# Patient Record
Sex: Female | Born: 1970
Health system: Southern US, Community
[De-identification: ages and names within clinical notes are randomized; demographics above are authoritative.]

## PROBLEM LIST (undated history)

## (undated) DIAGNOSIS — Z8619 Personal history of other infectious and parasitic diseases: Secondary | ICD-10-CM

## (undated) DIAGNOSIS — K219 Gastro-esophageal reflux disease without esophagitis: Secondary | ICD-10-CM

## (undated) DIAGNOSIS — M79604 Pain in right leg: Secondary | ICD-10-CM

## (undated) DIAGNOSIS — I499 Cardiac arrhythmia, unspecified: Secondary | ICD-10-CM

## (undated) DIAGNOSIS — D649 Anemia, unspecified: Secondary | ICD-10-CM

## (undated) DIAGNOSIS — M7989 Other specified soft tissue disorders: Secondary | ICD-10-CM

## (undated) DIAGNOSIS — M533 Sacrococcygeal disorders, not elsewhere classified: Secondary | ICD-10-CM

## (undated) DIAGNOSIS — M79605 Pain in left leg: Secondary | ICD-10-CM

## (undated) DIAGNOSIS — E669 Obesity, unspecified: Secondary | ICD-10-CM

## (undated) HISTORY — DX: Gastro-esophageal reflux disease without esophagitis: K21.9

## (undated) HISTORY — PX: TONSILLECTOMY: SUR1361

## (undated) HISTORY — DX: Cardiac arrhythmia, unspecified: I49.9

## (undated) HISTORY — DX: Other specified soft tissue disorders: M79.89

## (undated) HISTORY — PX: CHOLECYSTECTOMY: SHX55

## (undated) HISTORY — DX: Pain in right leg: M79.604

## (undated) HISTORY — DX: Obesity, unspecified: E66.9

## (undated) HISTORY — DX: Personal history of other infectious and parasitic diseases: Z86.19

## (undated) HISTORY — DX: Pain in left leg: M79.605

## (undated) HISTORY — DX: Anemia, unspecified: D64.9

---

## 2001-02-23 ENCOUNTER — Other Ambulatory Visit: Admission: RE | Admit: 2001-02-23 | Discharge: 2001-02-23 | Payer: Self-pay | Admitting: Obstetrics

## 2001-02-23 ENCOUNTER — Encounter (INDEPENDENT_AMBULATORY_CARE_PROVIDER_SITE_OTHER): Payer: Self-pay

## 2001-09-20 ENCOUNTER — Other Ambulatory Visit: Admission: RE | Admit: 2001-09-20 | Discharge: 2001-09-20 | Payer: Self-pay | Admitting: Obstetrics and Gynecology

## 2002-01-11 ENCOUNTER — Other Ambulatory Visit: Admission: RE | Admit: 2002-01-11 | Discharge: 2002-01-11 | Payer: Self-pay | Admitting: Obstetrics and Gynecology

## 2002-07-26 ENCOUNTER — Other Ambulatory Visit: Admission: RE | Admit: 2002-07-26 | Discharge: 2002-07-26 | Payer: Self-pay | Admitting: Obstetrics and Gynecology

## 2003-01-09 ENCOUNTER — Other Ambulatory Visit: Admission: RE | Admit: 2003-01-09 | Discharge: 2003-01-09 | Payer: Self-pay | Admitting: Obstetrics and Gynecology

## 2003-05-30 ENCOUNTER — Ambulatory Visit (HOSPITAL_COMMUNITY): Admission: RE | Admit: 2003-05-30 | Discharge: 2003-05-30 | Payer: Self-pay | Admitting: Family Medicine

## 2003-05-30 ENCOUNTER — Encounter: Payer: Self-pay | Admitting: Family Medicine

## 2003-09-25 ENCOUNTER — Other Ambulatory Visit: Admission: RE | Admit: 2003-09-25 | Discharge: 2003-09-25 | Payer: Self-pay | Admitting: Obstetrics and Gynecology

## 2004-11-24 ENCOUNTER — Encounter: Payer: Self-pay | Admitting: Cardiovascular Disease

## 2004-11-24 ENCOUNTER — Ambulatory Visit: Payer: Self-pay | Admitting: Family Medicine

## 2004-12-13 ENCOUNTER — Ambulatory Visit: Payer: Self-pay

## 2004-12-31 ENCOUNTER — Other Ambulatory Visit: Admission: RE | Admit: 2004-12-31 | Discharge: 2004-12-31 | Payer: Self-pay | Admitting: Obstetrics and Gynecology

## 2006-04-19 LAB — CONVERTED CEMR LAB: Pap Smear: NORMAL

## 2007-02-23 ENCOUNTER — Ambulatory Visit: Payer: Self-pay | Admitting: Family Medicine

## 2007-02-23 DIAGNOSIS — R87619 Unspecified abnormal cytological findings in specimens from cervix uteri: Secondary | ICD-10-CM | POA: Insufficient documentation

## 2007-02-23 DIAGNOSIS — N859 Noninflammatory disorder of uterus, unspecified: Secondary | ICD-10-CM | POA: Insufficient documentation

## 2007-02-26 LAB — CONVERTED CEMR LAB
ALT: 13 units/L (ref 0–35)
AST: 16 units/L (ref 0–37)
Albumin: 3.9 g/dL (ref 3.5–5.2)
Alkaline Phosphatase: 74 units/L (ref 39–117)
BUN: 10 mg/dL (ref 6–23)
Basophils Absolute: 0.1 10*3/uL (ref 0.0–0.1)
Basophils Relative: 0.8 % (ref 0.0–1.0)
Bilirubin, Direct: 0.1 mg/dL (ref 0.0–0.3)
CO2: 27 meq/L (ref 19–32)
Calcium: 8.8 mg/dL (ref 8.4–10.5)
Chloride: 110 meq/L (ref 96–112)
Cholesterol: 194 mg/dL (ref 0–200)
Creatinine, Ser: 0.7 mg/dL (ref 0.4–1.2)
Eosinophils Absolute: 0.1 10*3/uL (ref 0.0–0.6)
Eosinophils Relative: 1.3 % (ref 0.0–5.0)
GFR calc Af Amer: 120 mL/min
GFR calc non Af Amer: 100 mL/min
Glucose, Bld: 86 mg/dL (ref 70–99)
HCT: 41.4 % (ref 36.0–46.0)
HDL: 50.7 mg/dL (ref >39.0)
Hemoglobin: 14.2 g/dL (ref 12.0–15.0)
LDL Cholesterol: 127 mg/dL — ABNORMAL HIGH (ref 0–99)
Lymphocytes Relative: 41.9 % (ref 12.0–46.0)
MCHC: 34.2 g/dL (ref 30.0–36.0)
MCV: 92.5 fL (ref 78.0–100.0)
Monocytes Absolute: 0.6 10*3/uL (ref 0.2–0.7)
Monocytes Relative: 8.1 % (ref 3.0–11.0)
Neutro Abs: 3.8 10*3/uL (ref 1.4–7.7)
Neutrophils Relative %: 47.9 % (ref 43.0–77.0)
Platelets: 307 10*3/uL (ref 150–400)
Potassium: 4.3 meq/L (ref 3.5–5.1)
RBC: 4.48 M/uL (ref 3.87–5.11)
RDW: 12.3 % (ref 11.5–14.6)
Sodium: 142 meq/L (ref 135–145)
TSH: 1.45 microintl units/mL (ref 0.35–5.50)
Total Bilirubin: 0.6 mg/dL (ref 0.3–1.2)
Total CHOL/HDL Ratio: 3.8
Total Protein: 6.5 g/dL (ref 6.0–8.3)
Triglycerides: 84 mg/dL (ref 0–149)
VLDL: 17 mg/dL (ref 0–40)
WBC: 8 10*3/uL (ref 4.5–10.5)

## 2008-10-21 ENCOUNTER — Encounter: Payer: Self-pay | Admitting: Cardiovascular Disease

## 2009-05-22 ENCOUNTER — Encounter: Payer: Self-pay | Admitting: Cardiovascular Disease

## 2009-05-26 ENCOUNTER — Encounter: Payer: Self-pay | Admitting: Cardiovascular Disease

## 2009-06-22 ENCOUNTER — Ambulatory Visit: Payer: Self-pay | Admitting: Cardiovascular Disease

## 2009-06-22 DIAGNOSIS — E782 Mixed hyperlipidemia: Secondary | ICD-10-CM | POA: Insufficient documentation

## 2009-06-22 DIAGNOSIS — I080 Rheumatic disorders of both mitral and aortic valves: Secondary | ICD-10-CM | POA: Insufficient documentation

## 2009-06-22 DIAGNOSIS — R002 Palpitations: Secondary | ICD-10-CM

## 2009-07-03 ENCOUNTER — Ambulatory Visit: Payer: Self-pay

## 2009-07-03 ENCOUNTER — Ambulatory Visit (HOSPITAL_COMMUNITY): Admission: RE | Admit: 2009-07-03 | Discharge: 2009-07-03 | Payer: Self-pay | Admitting: Cardiovascular Disease

## 2009-07-03 ENCOUNTER — Ambulatory Visit: Payer: Self-pay | Admitting: Internal Medicine

## 2009-07-03 ENCOUNTER — Encounter: Payer: Self-pay | Admitting: Cardiovascular Disease

## 2009-08-11 ENCOUNTER — Telehealth (INDEPENDENT_AMBULATORY_CARE_PROVIDER_SITE_OTHER): Payer: Self-pay | Admitting: *Deleted

## 2010-09-07 NOTE — Progress Notes (Signed)
  Phone Note Other Incoming   Caller: Clydie Braun  Reason for Call: Get patient information Action Taken: Information Sent Initial call taken by: Denny Peon    Faxed all Recent Cardiac over to [facility] to fax [phone] [facility]  August 11, 2009 12:59 PM

## 2013-01-28 ENCOUNTER — Other Ambulatory Visit: Payer: Self-pay | Admitting: *Deleted

## 2013-01-28 ENCOUNTER — Encounter: Payer: Self-pay | Admitting: Surgery

## 2013-01-28 DIAGNOSIS — R609 Edema, unspecified: Secondary | ICD-10-CM

## 2013-02-13 ENCOUNTER — Encounter: Payer: Self-pay | Admitting: Vascular Surgery

## 2013-02-14 ENCOUNTER — Encounter: Payer: Self-pay | Admitting: Vascular Surgery

## 2013-03-04 ENCOUNTER — Encounter: Payer: Self-pay | Admitting: Surgery

## 2013-03-04 ENCOUNTER — Encounter: Payer: Self-pay | Admitting: Vascular Surgery

## 2013-03-05 ENCOUNTER — Ambulatory Visit (INDEPENDENT_AMBULATORY_CARE_PROVIDER_SITE_OTHER): Payer: Commercial Managed Care - PPO | Admitting: Vascular Surgery

## 2013-03-05 ENCOUNTER — Encounter (INDEPENDENT_AMBULATORY_CARE_PROVIDER_SITE_OTHER): Payer: Commercial Managed Care - PPO | Admitting: *Deleted

## 2013-03-05 ENCOUNTER — Encounter: Payer: Self-pay | Admitting: Vascular Surgery

## 2013-03-05 VITALS — BP 116/78 | HR 80 | Resp 18 | Ht 66.5 in | Wt 223.2 lb

## 2013-03-05 DIAGNOSIS — R609 Edema, unspecified: Secondary | ICD-10-CM

## 2013-03-05 DIAGNOSIS — M7989 Other specified soft tissue disorders: Secondary | ICD-10-CM | POA: Insufficient documentation

## 2013-03-05 DIAGNOSIS — M79609 Pain in unspecified limb: Secondary | ICD-10-CM

## 2013-03-05 DIAGNOSIS — I83893 Varicose veins of bilateral lower extremities with other complications: Secondary | ICD-10-CM

## 2013-03-05 NOTE — Progress Notes (Signed)
[facility] [place]   Patient name: Wendy Cummings MRN: [id number] DOB: 09/13/1970 Sex: female   Referred by: [place]  Reason for referral:  Chief Complaint  Patient presents with  . New Evaluation    c/o bilateral leg pain and swelling L>R   worse with walking for exercise    HISTORY OF PRESENT ILLNESS: Patient has today for dilation of bilateral lower surety pain and some swelling. She has a somewhat unusual situation. She reports that she has pain in both feet when she first arises in the morning that he can last several minutes and improves. If she does a great deal of exercise she can have some swelling in both legs and some discomfort as well. At this is not true calf claudication. She does not have swelling in that she is exercising. She has seen orthopedics for evaluation with no evidence for any cause of her discomfort from an orthopedic standpoint  Past Medical History  Diagnosis Date  . Leg pain, bilateral   . Leg swelling     History reviewed. No pertinent past surgical history.  History   Social History  . Marital Status: Single    Spouse Name: N/A    Number of Children: N/A  . Years of Education: N/A   Occupational History  . Not on file.   Social History Main Topics  . Smoking status: Former Smoker    Quit date: 01/31/2012  . Smokeless tobacco: Not on file  . Alcohol Use: No  . Drug Use: No  . Sexually Active: Not on file   Other Topics Concern  . Not on file   Social History Narrative  . No narrative on file    History reviewed. No pertinent family history.  Allergies as of 03/05/2013 - Review Complete 03/05/2013  Allergen Reaction Noted  . Tetanus toxoid      Current Outpatient Prescriptions on File Prior to Visit  Medication Sig Dispense Refill  . Multiple Vitamin (MULTIVITAMIN) capsule Take 1 capsule by mouth daily.      . MetroNIDAZOLE (FLAGYL PO) Take by mouth as needed.       No current facility-administered  medications on file prior to visit.     REVIEW OF SYSTEMS:  Positives indicated with an "X"  CARDIOVASCULAR:  [ ]  chest pain   [ ]  chest pressure   [x ] palpitations   [ ]  orthopnea   [ ]  dyspnea on exertion   [ ]  claudication   [ ]  rest pain   [ ]  DVT   [ ]  phlebitis PULMONARY:   [ ]  productive cough   [ ]  asthma   [ ]  wheezing NEUROLOGIC:   [ ]  weakness  [x ] paresthesias  [ ]  aphasia  [ ]  amaurosis  [ ]  dizziness HEMATOLOGIC:   [ ]  bleeding problems   [ ]  clotting disorders MUSCULOSKELETAL:  [ ]  joint pain   [ ]  joint swelling GASTROINTESTINAL: [ ]   blood in stool  [ ]   hematemesis GENITOURINARY:  [ ]   dysuria  [ ]   hematuria PSYCHIATRIC:  [ ]  history of major depression INTEGUMENTARY:  [ ]  rashes  [ ]  ulcers CONSTITUTIONAL:  [ ]  fever   [ ]  chills  PHYSICAL EXAMINATION:  General: The patient is a well-nourished female, in no acute distress. Vital signs are BP 116/78  Pulse 80  Resp 18  Ht 5' 6.5" (1.689 m)  Wt 223 lb 3.2 oz (101.243 kg)  BMI 35.49 kg/m2  Pulmonary: There is a good air exchange bilaterally  Abdomen: Soft and non-tender  Musculoskeletal: There are no major deformities.  There is no significant extremity pain. Neurologic: No focal weakness or paresthesias are detected, Skin: There are no ulcer or rashes noted. Psychiatric: The patient has normal affect. Pulse status: 2+ radial 2+ femoral and 2+ dorsalis pedis pulses bilaterally No evidence of venous varicosities or telangiectasia. Swelling in  [facility] Vascular Lab Studies:  Ordered and Independently Reviewed vascular lab studies are near normal. She has no evidence of significant deep or superficial reflux in her left leg and does have mild reflux in the mid thigh on the right leg great saphenous vein.  Impression and Plan:  Long discussion with the patient. She does not have any evidence of arterial and no significant venous pathology to explain her pain. I explained that I do not feel that there should be  any limitation to her walking program. She does not have any evidence of lymphedema or other issues. She will followup with [place] on an as-needed basis    Wendy Cummings [facility] [place] Office: [phone]

## 2013-08-08 HISTORY — PX: PLANTAR FASCIA SURGERY: SHX746

## 2017-01-06 HISTORY — PX: BARIATRIC SURGERY: SHX1103

## 2017-09-18 ENCOUNTER — Other Ambulatory Visit: Payer: Self-pay | Admitting: Obstetrics and Gynecology

## 2017-09-18 DIAGNOSIS — R928 Other abnormal and inconclusive findings on diagnostic imaging of breast: Secondary | ICD-10-CM

## 2017-09-22 ENCOUNTER — Ambulatory Visit
Admission: RE | Admit: 2017-09-22 | Discharge: 2017-09-22 | Disposition: A | Discharge status: Home / Self Care | Payer: 59 | Source: Ambulatory Visit | Attending: Obstetrics and Gynecology | Admitting: Obstetrics and Gynecology

## 2017-09-22 ENCOUNTER — Ambulatory Visit: Payer: Commercial Managed Care - PPO

## 2017-09-22 DIAGNOSIS — R928 Other abnormal and inconclusive findings on diagnostic imaging of breast: Secondary | ICD-10-CM

## 2018-10-05 LAB — HM MAMMOGRAPHY

## 2019-03-09 DIAGNOSIS — U071 COVID-19: Secondary | ICD-10-CM

## 2019-03-09 HISTORY — DX: COVID-19: U07.1

## 2019-04-25 ENCOUNTER — Ambulatory Visit: Payer: 59 | Admitting: Family Medicine

## 2019-05-16 ENCOUNTER — Encounter: Payer: Self-pay | Admitting: Family Medicine

## 2019-05-16 ENCOUNTER — Ambulatory Visit: Payer: 59 | Admitting: Family Medicine

## 2019-05-16 VITALS — BP 138/80 | HR 77 | Temp 97.7°F | Ht 66.5 in | Wt 166.6 lb

## 2019-05-16 DIAGNOSIS — Z7689 Persons encountering health services in other specified circumstances: Secondary | ICD-10-CM | POA: Diagnosis not present

## 2019-05-16 DIAGNOSIS — R195 Other fecal abnormalities: Secondary | ICD-10-CM

## 2019-05-16 DIAGNOSIS — H5712 Ocular pain, left eye: Secondary | ICD-10-CM

## 2019-05-16 DIAGNOSIS — R519 Headache, unspecified: Secondary | ICD-10-CM

## 2019-05-16 MED ORDER — AMITRIPTYLINE HCL 25 MG PO TABS: 25.0000 mg | ORAL_TABLET | Freq: Every day | ORAL | 0 refills | Status: DC

## 2019-05-16 NOTE — Progress Notes (Signed)
Wendy Cummings is a 48 y.o. female  Chief Complaint  Patient presents with  . Establish Care  . Eye Problem    left eye, when she has a flare up, vision becoming blurry, scalp feels tight, & diarrhea     HPI: Wendy Cummings is a 48 y.o. female here to establish care with our office.  Previous PCP Dr. Zola Button. Left eye: Most recently seen at [facility] but issue started in 07/2017. She had full panel of labs including auto-immune. 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 She is on acyclovir 800mg  TID. Pt has persistent cloudy vision due to scar on cornea.  Pt has "flare-ups" - Scalp hurts (not headache) for about 2 days then goes away, then Lt eye hurts, may look red, and pt feels as though her "eyeball is swollen". Pt endorses light sensitivity. Pt also endorses increase diarrhea (increased frequency and more watery) during this time. Pt has had diarrhea since GB was removed in 01/2017. She feels symptoms/flares occur after period of increased stress. Prednisone gtts help with occular symptoms noted above.  Pt sees Dr. 02/2017 ("auto-immune doctor for the eye") - normal eval thus far. Pt states he had recommended she try a medication "for arthritis" which she filled but then read the package inset and "warning" that came with the med and she elected not to take it.   Pt had weight loss surgery in 01/2017 - sleeve - as well as cholecystectomy due to "sludge".  Pt has lost 110lbs.  Dr. 02/2017 - bariatric surgery  Past Medical History:  Diagnosis Date  . Leg pain, bilateral   . Leg swelling     Past Surgical History:  Procedure Laterality Date  . BARIATRIC SURGERY  01/2017   gastric sleeve   . CHOLECYSTECTOMY    . PLANTAR FASCIA SURGERY  2015  . TONSILLECTOMY      Social History   Socioeconomic History  . Marital status: Married    Spouse name: Not on file  . Number of children: Not on file  . Years of education: Not on file  . Highest education  level: Not on file  Occupational History  . Not on file  Social Needs  . Financial resource strain: Not on file  . Food insecurity    Worry: Not on file    Inability: Not on file  . Transportation needs    Medical: Not on file    Non-medical: Not on file  Tobacco Use  . Smoking status: Former Smoker    Packs/day: 0.50    Types: Cigarettes    Quit date: 01/31/2012    Years since quitting: 7.2  . Smokeless tobacco: Never Used  Substance and Sexual Activity  . Alcohol use: Yes  . Drug use: No  . Sexual activity: Not on file  Lifestyle  . Physical activity    Days per week: Not on file    Minutes per session: Not on file  . Stress: Not on file  Relationships  . Social 02/02/2012 on phone: Not on file    Gets together: Not on file    Attends religious service: Not on file    Active member of club or organization: Not on file    Attends meetings of clubs or organizations: Not on file    Relationship status: Not on file  . Intimate partner violence    Fear of current or ex partner: Not on file  Emotionally abused: Not on file    Physically abused: Not on file    Forced sexual activity: Not on file  Other Topics Concern  . Not on file  Social History Narrative  . Not on file    Family History  Problem Relation Age of Onset  . Esophageal cancer Father   . Colon cancer Maternal Grandfather       There is no immunization history on file for this patient.  Outpatient Encounter Medications as of 05/16/2019  Medication Sig  . acyclovir (ZOVIRAX) 800 MG tablet Take 800 mg by mouth 3 (three) times daily.  . Multiple Vitamin (MULTIVITAMIN) capsule Take 1 capsule by mouth daily.  . prednisoLONE acetate (PRED FORTE) 1 % ophthalmic suspension as needed.  . [DISCONTINUED] MetroNIDAZOLE (FLAGYL PO) Take by mouth as needed.  . [DISCONTINUED] phentermine 37.5 MG capsule Take 37.5 mg by mouth every morning.   No facility-administered encounter medications on file as of  05/16/2019.      ROS: Pertinent positives and negatives noted in HPI. Remainder of ROS non-contributory  Allergies  Allergen Reactions  . Tetanus Toxoid     REACTION: SWELLING    BP 138/80   Pulse 77   Temp 97.7 F (36.5 C)   Ht 5' 6.5" (1.689 m)   Wt 166 lb 9.6 oz (75.6 kg)   SpO2 99%   BMI 26.49 kg/m   Physical Exam  Constitutional: She is oriented to person, place, and time. She appears well-developed and well-nourished.  HENT:  Head: Normocephalic and atraumatic. Head is without abrasion and without contusion.  Eyes: EOM are normal. Right pupil is round and reactive. Left pupil is round and reactive.  Cardiovascular: Normal rate, regular rhythm and normal heart sounds.  Pulmonary/Chest: Effort normal and breath sounds normal.  Abdominal: Soft. Bowel sounds are normal. She exhibits no distension. There is no abdominal tenderness.  Musculoskeletal:        General: No edema.  Neurological: She is alert and oriented to person, place, and time.  Psychiatric: She has a normal mood and affect. Her behavior is normal.     A/P:  1. Encounter to establish care with new doctor  2. Eye pain, left 3. Loose stools 4. Scalp pain - pt with Lt eye issues since 07/2017 and she has seen at least 2 ophthalmologists, currently seeing Dr. Manuella Ghazi - pt states she now has "flares" of symptoms consisting of 1. Tingling sensation of scalp, 2. Eye pain and feeling of swelling, 3. Increased frequency of diarrhea - she declined trial of med recommended by Dr. Manuella Ghazi (? Methotrexate) due to potential side effects - negative auto-immune work-up - ? Stress related - cont acyclovir 800mg  TID Rx: Amitriptyline 25mg  qHS - f/u in 4 wks or sooner PRN - cont regular f/u with Dr. Manuella Ghazi Discussed plan and reviewed medications with patient, including risks, benefits, and potential side effects. Pt expressed understand. All questions answered.

## 2019-06-12 ENCOUNTER — Telehealth: Payer: Self-pay

## 2019-06-12 NOTE — Telephone Encounter (Signed)

## 2019-06-13 ENCOUNTER — Other Ambulatory Visit: Payer: Self-pay

## 2019-06-13 ENCOUNTER — Ambulatory Visit: Payer: 59 | Admitting: Family Medicine

## 2019-06-13 ENCOUNTER — Encounter: Payer: Self-pay | Admitting: Family Medicine

## 2019-06-13 VITALS — BP 120/80 | HR 63 | Temp 98.5°F | Ht 66.0 in | Wt 165.8 lb

## 2019-06-13 DIAGNOSIS — H15002 Unspecified scleritis, left eye: Secondary | ICD-10-CM | POA: Diagnosis not present

## 2019-06-13 NOTE — Progress Notes (Signed)
Wendy Cummings is a 48 y.o. female  Chief Complaint  Patient presents with  . Follow-up    follow up from prev visit. patient said everything is still the same.    HPI: Wendy Cummings is a 48 y.o. female here to f/u on last OV on 05/16/19 where pt was started on amitriptyline 25mg  qHS for constellation of symptoms - scalp pain/tingling, eye pain, diarrhea - to see if she noted any improvement. She has been following with ophthalmology Dr. Manuella Ghazi and was recommended by him to start immunosuppressive agent azathioprine but pt did not due this after reading possible side effects. Today she notes no change with amitriptyline and states symptoms are "still the same". She has another "eye breakout" for which she eventually used steroid gtts and symptoms improved with 4 days of use.  She has an appt with Dr. Manuella Ghazi 07/26/19.   Past Medical History:  Diagnosis Date  . Leg pain, bilateral   . Leg swelling     Past Surgical History:  Procedure Laterality Date  . BARIATRIC SURGERY  01/2017   gastric sleeve   . CHOLECYSTECTOMY    . PLANTAR FASCIA SURGERY  2015  . TONSILLECTOMY      Social History   Socioeconomic History  . Marital status: Married    Spouse name: Not on file  . Number of children: Not on file  . Years of education: Not on file  . Highest education level: Not on file  Occupational History  . Not on file  Social Needs  . Financial resource strain: Not on file  . Food insecurity    Worry: Not on file    Inability: Not on file  . Transportation needs    Medical: Not on file    Non-medical: Not on file  Tobacco Use  . Smoking status: Former Smoker    Packs/day: 0.50    Types: Cigarettes    Quit date: 01/31/2012    Years since quitting: 7.3  . Smokeless tobacco: Never Used  Substance and Sexual Activity  . Alcohol use: Yes  . Drug use: No  . Sexual activity: Not on file  Lifestyle  . Physical activity    Days per week: Not on file    Minutes per session: Not on  file  . Stress: Not on file  Relationships  . Social Herbalist on phone: Not on file    Gets together: Not on file    Attends religious service: Not on file    Active member of club or organization: Not on file    Attends meetings of clubs or organizations: Not on file    Relationship status: Not on file  . Intimate partner violence    Fear of current or ex partner: Not on file    Emotionally abused: Not on file    Physically abused: Not on file    Forced sexual activity: Not on file  Other Topics Concern  . Not on file  Social History Narrative  . Not on file    Family History  Problem Relation Age of Onset  . Esophageal cancer Father   . Colon cancer Maternal Grandfather       There is no immunization history on file for this patient.  Outpatient Encounter Medications as of 06/13/2019  Medication Sig  . acyclovir (ZOVIRAX) 800 MG tablet Take 800 mg by mouth 5 (five) times daily.  Marland Kitchen amitriptyline (ELAVIL) 25 MG tablet Take 1 tablet (25 mg  total) by mouth at bedtime.  . Multiple Vitamin (MULTIVITAMIN) capsule Take 1 capsule by mouth daily.  . prednisoLONE acetate (PRED FORTE) 1 % ophthalmic suspension as needed.   No facility-administered encounter medications on file as of 06/13/2019.      ROS: Gen: no fever, chills  Eyes: as above in HPI Resp: no cough, wheeze,SOB CV: no CP, palpitations, LE edema GI: no heartburn, n/v/, abd pain; + diarrhea  GU: no dysuria, urgency, frequency, hematuria  MSK: no joint pain, myalgias, back pain Neuro: no dizziness, headache, weakness    Allergies  Allergen Reactions  . Tetanus Toxoid     REACTION: SWELLING    BP 120/80   Pulse 63   Temp 98.5 F (36.9 C) (Temporal)   Ht 5\' 6"  (1.676 m)   Wt 165 lb 12.8 oz (75.2 kg)   SpO2 100%   BMI 26.76 kg/m   Physical Exam  Constitutional: She is oriented to person, place, and time. She appears well-developed and well-nourished.  Eyes: Pupils are equal, round, and  reactive to light. Conjunctivae and EOM are normal. Right eye exhibits no discharge. Left eye exhibits no discharge.  Neurological: She is alert and oriented to person, place, and time.  Psychiatric: She has a normal mood and affect. Her behavior is normal.     A/P:  1. Scleritis of left eye - d/c amitriptyline  - advised pt to schedule sooner f/u with Dr. to discuss her concerns/questions about imuran. She is agreeable to trying the med to see if it is effective at improving/relieving her symptoms, but has a few questions she would like answers to and more info about.  - pt understands and is agreeable to above plan - f/u PRN, pt will schedule CPE in 08/2019

## 2019-08-21 ENCOUNTER — Other Ambulatory Visit: Payer: Self-pay

## 2019-08-22 ENCOUNTER — Ambulatory Visit (INDEPENDENT_AMBULATORY_CARE_PROVIDER_SITE_OTHER): Payer: 59 | Admitting: Family Medicine

## 2019-08-22 ENCOUNTER — Encounter: Payer: Self-pay | Admitting: Gastroenterology

## 2019-08-22 ENCOUNTER — Encounter: Payer: Self-pay | Admitting: Family Medicine

## 2019-08-22 VITALS — BP 110/72 | HR 60 | Temp 96.6°F | Ht 66.0 in | Wt 167.8 lb

## 2019-08-22 DIAGNOSIS — Z1211 Encounter for screening for malignant neoplasm of colon: Secondary | ICD-10-CM

## 2019-08-22 DIAGNOSIS — E782 Mixed hyperlipidemia: Secondary | ICD-10-CM | POA: Diagnosis not present

## 2019-08-22 DIAGNOSIS — Z0000 Encounter for general adult medical examination without abnormal findings: Secondary | ICD-10-CM

## 2019-08-22 LAB — LIPID PANEL
Cholesterol: 183 mg/dL (ref 0–200)
HDL: 56.3 mg/dL (ref >39.00)
LDL Cholesterol: 104 mg/dL — ABNORMAL HIGH (ref 0–99)
NonHDL: 126.32
Total CHOL/HDL Ratio: 3
Triglycerides: 112 mg/dL (ref 0.0–149.0)
VLDL: 22.4 mg/dL (ref 0.0–40.0)

## 2019-08-22 LAB — BASIC METABOLIC PANEL
BUN: 12 mg/dL (ref 6–23)
CO2: 28 mEq/L (ref 19–32)
Calcium: 8.9 mg/dL (ref 8.4–10.5)
Chloride: 107 mEq/L (ref 96–112)
Creatinine, Ser: 0.76 mg/dL (ref 0.40–1.20)
GFR: 80.37 mL/min (ref >60.00)
Glucose, Bld: 88 mg/dL (ref 70–99)
Potassium: 4.5 mEq/L (ref 3.5–5.1)
Sodium: 141 mEq/L (ref 135–145)

## 2019-08-22 LAB — ALT: ALT: 28 U/L (ref 0–35)

## 2019-08-22 LAB — AST: AST: 39 U/L — ABNORMAL HIGH (ref 0–37)

## 2019-08-22 LAB — VITAMIN D 25 HYDROXY (VIT D DEFICIENCY, FRACTURES): VITD: 30.99 ng/mL (ref 30.00–100.00)

## 2019-08-22 NOTE — Progress Notes (Signed)
Wendy Cummings is a 49 y.o. female  Chief Complaint  Patient presents with  . Annual Exam    no pap, will be done in March at GYN    HPI: Wendy Cummings is a 49 y.o. female here for annual CPE, fasting labs.   Last PAP: follows with GYN, due in 10/2019 Last mammo: 09/2018 Last colonoscopy: never  Pt wonders about having EGD done at same time d/t h/o gastric sleeve  Diet/Exercise: good diet with h/o gastric sleeve; regular exercise  Med refills needed today? None  Dr. Carlynn Purl - [facility] ("eye immunology")   Past Medical History:  Diagnosis Date  . Leg pain, bilateral   . Leg swelling     Past Surgical History:  Procedure Laterality Date  . BARIATRIC SURGERY  01/2017   gastric sleeve   . CHOLECYSTECTOMY    . PLANTAR FASCIA SURGERY  2015  . TONSILLECTOMY      Social History   Socioeconomic History  . Marital status: Married    Spouse name: Not on file  . Number of children: Not on file  . Years of education: Not on file  . Highest education level: Not on file  Occupational History  . Not on file  Tobacco Use  . Smoking status: Former Smoker    Packs/day: 0.50    Types: Cigarettes    Quit date: 01/31/2012    Years since quitting: 7.5  . Smokeless tobacco: Never Used  Substance and Sexual Activity  . Alcohol use: Yes  . Drug use: No  . Sexual activity: Not on file  Other Topics Concern  . Not on file  Social History Narrative  . Not on file   Social Determinants of Health   Financial Resource Strain:   . Difficulty of Paying Living Expenses: Not on file  Food Insecurity:   . Worried About Programme researcher, broadcasting/film/video in the Last Year: Not on file  . Ran Out of Food in the Last Year: Not on file  Transportation Needs:   . Lack of Transportation (Medical): Not on file  . Lack of Transportation (Non-Medical): Not on file  Physical Activity:   . Days of Exercise per Week: Not on file  . Minutes of Exercise per Session: Not on file  Stress:   . Feeling  of Stress : Not on file  Social Connections:   . Frequency of Communication with Friends and Family: Not on file  . Frequency of Social Gatherings with Friends and Family: Not on file  . Attends Religious Services: Not on file  . Active Member of Clubs or Organizations: Not on file  . Attends Banker Meetings: Not on file  . Marital Status: Not on file  Intimate Partner Violence:   . Fear of Current or Ex-Partner: Not on file  . Emotionally Abused: Not on file  . Physically Abused: Not on file  . Sexually Abused: Not on file    Family History  Problem Relation Age of Onset  . Esophageal cancer Father   . Colon cancer Maternal Grandfather       There is no immunization history on file for this patient.  Outpatient Encounter Medications as of 08/22/2019  Medication Sig  . Multiple Vitamin (MULTIVITAMIN) capsule Take 1 capsule by mouth daily.  Marland Kitchen acyclovir (ZOVIRAX) 800 MG tablet Take 800 mg by mouth 5 (five) times daily.  . [DISCONTINUED] amitriptyline (ELAVIL) 25 MG tablet Take 1 tablet (25 mg total) by mouth at bedtime. (  Patient not taking: Reported on 08/22/2019)  . [DISCONTINUED] prednisoLONE acetate (PRED FORTE) 1 % ophthalmic suspension as needed.   No facility-administered encounter medications on file as of 08/22/2019.     ROS: Gen: no fever, chills  Skin: no rash, itching ENT: no ear pain, ear drainage, nasal congestion, rhinorrhea, sinus pressure, sore throat Eyes: scleritis Lt eye Resp: no cough, wheeze,SOB CV: no CP, palpitations, LE edema,  GI: no heartburn, n/v/d/c, abd pain GU: no dysuria, urgency, frequency, hematuria MSK: no joint pain, myalgias, back pain Neuro: no dizziness, headache, weakness, vertigo Psych: no depression, anxiety, insomnia   Allergies  Allergen Reactions  . Tetanus Toxoid     REACTION: SWELLING    BP 110/72   Pulse 60   Temp (!) 96.6 F (35.9 C)   Ht 5\' 6"  (1.676 m)   Wt 167 lb 12.8 oz (76.1 kg)   SpO2 100%    BMI 27.08 kg/m   Physical Exam  Constitutional: She is oriented to person, place, and time. She appears well-developed and well-nourished. No distress.  HENT:  Head: Normocephalic and atraumatic.  Right Ear: Tympanic membrane and ear canal normal.  Left Ear: Tympanic membrane and ear canal normal.  Nose: Nose normal.  Mouth/Throat: Oropharynx is clear and moist and mucous membranes are normal.  Eyes: Pupils are equal, round, and reactive to light. Conjunctivae are normal.  Neck: No thyromegaly present.  Cardiovascular: Normal rate, regular rhythm, normal heart sounds and intact distal pulses.  No murmur heard. Pulmonary/Chest: Effort normal and breath sounds normal. No respiratory distress. She has no wheezes. She has no rhonchi.  Abdominal: Soft. Bowel sounds are normal. She exhibits no distension and no mass. There is no abdominal tenderness.  Musculoskeletal:        General: No edema.     Cervical back: Neck supple.  Lymphadenopathy:    She has no cervical adenopathy.  Neurological: She is alert and oriented to person, place, and time. She exhibits normal muscle tone. Coordination normal.  Skin: Skin is warm and dry.  Psychiatric: She has a normal mood and affect. Her behavior is normal.     A/P:  1. Annual physical exam - discussed importance of regular CV exercise, healthy diet, adequate sleep - UTD on PAP, mammo - referral for colonoscopy placed today - immunizations UTD - ALT - Basic metabolic panel - AST - Lipid panel - VITAMIN D 25 Hydroxy (Vit-D Deficiency, Fractures) - next CPE in 1 year  2. HYPERLIPIDEMIA, MIXED, MILD - cont low fat diet and regular CV exercise - Lipid panel  3. Screening for colon cancer - Colonoscopy Referral  This visit occurred during the SARS-CoV-2 public health emergency.  Safety protocols were in place, including screening questions prior to the visit, additional usage of staff PPE, and extensive cleaning of exam room while observing  appropriate contact time as indicated for disinfecting solutions.

## 2019-08-28 ENCOUNTER — Encounter: Payer: Self-pay | Admitting: Family Medicine

## 2019-09-13 ENCOUNTER — Encounter: Payer: Self-pay | Admitting: Gastroenterology

## 2019-09-13 ENCOUNTER — Ambulatory Visit: Payer: 59 | Admitting: Gastroenterology

## 2019-09-13 ENCOUNTER — Other Ambulatory Visit: Payer: Self-pay

## 2019-09-13 VITALS — BP 130/82 | HR 60 | Temp 97.1°F | Ht 66.0 in | Wt 170.1 lb

## 2019-09-13 DIAGNOSIS — Z8371 Family history of colonic polyps: Secondary | ICD-10-CM

## 2019-09-13 DIAGNOSIS — Z800 Family history of malignant neoplasm of digestive organs: Secondary | ICD-10-CM | POA: Diagnosis not present

## 2019-09-13 DIAGNOSIS — R197 Diarrhea, unspecified: Secondary | ICD-10-CM | POA: Diagnosis not present

## 2019-09-13 DIAGNOSIS — Z01818 Encounter for other preprocedural examination: Secondary | ICD-10-CM | POA: Diagnosis not present

## 2019-09-13 DIAGNOSIS — Z1211 Encounter for screening for malignant neoplasm of colon: Secondary | ICD-10-CM | POA: Diagnosis not present

## 2019-09-13 MED ORDER — CLENPIQ 10-3.5-12 MG-GM -GM/160ML PO SOLN: 1.0000 | Freq: Once | ORAL | 0 refills | Status: AC

## 2019-09-13 NOTE — Progress Notes (Signed)
Chief Complaint: CRC Screening, diarrhea  Referring Provider:     Overton Mam, DO   HPI:     Wendy Cummings is a 49 y.o. female with a history of SIPS/gastric sleeve and ccy in 2018, referred to the [facility]astro[facility]ology Clinic for evaluation of initial CRC screening along with evaluation for diarrhea.  Since surgery has had diarrhea. No hematochezia. Stools always loose then go to watery. Has trialed cholestyramine- c/b constipation.  No abdominal pain.  Hx of shingles in left eye. Will have intemittent episodes of b/l scalp pain followed by diarrhea. Has since stopped all medications for this. Following with [facility] Ophthamologist/Allergy Eye specialist.  No history of reflux, dysphagia.  Received first dose of COVID-19 vaccine and schedule for second dose next week.  Father with Esophageal CA now s/p Chemo/XRT and esophagectomy. MGF with CRC. Mother with polyps. No Fhx of IBD.   Reviewed recent labs from 08/2019: Normal BMP, vitamin D, ALT, AST was 39.  No recent abdominal imaging for review.  No previous EGD or colonoscopy.  Past Medical History:  Diagnosis Date  . Arrhythmia   . History of shingles    left eye  . Leg pain, bilateral   . Leg swelling   . Obesity      Past Surgical History:  Procedure Laterality Date  . BARIATRIC SURGERY  01/2017   gastric sleeve   . CHOLECYSTECTOMY    . PLANTAR FASCIA SURGERY  2015  . TONSILLECTOMY     Family History  Problem Relation Age of Onset  . Colon polyps Mother   . Esophageal cancer Father   . Prostate cancer Father   . Colon cancer Maternal Grandfather    Social History   Tobacco Use  . Smoking status: Former Smoker    Packs/day: 0.50    Types: Cigarettes    Quit date: 01/31/2012    Years since quitting: 7.6  . Smokeless tobacco: Never Used  Substance Use Topics  . Alcohol use: Yes  . Drug use: No   Current Outpatient Medications  Medication Sig Dispense Refill  . Multiple Vitamin  (MULTIVITAMIN) capsule Take 1 capsule by mouth daily.    . prednisoLONE acetate (PRED FORTE) 1 % ophthalmic suspension Place 1 drop into the left eye as needed.     No current facility-administered medications for this visit.   Allergies  Allergen Reactions  . Tetanus Toxoid     REACTION: SWELLING     Review of Systems: All systems reviewed and negative except where noted in HPI.     Physical Exam:    Wt Readings from Last 3 Encounters:  09/13/19 170 lb 2 oz (77.2 kg)  08/22/19 167 lb 12.8 oz (76.1 kg)  06/13/19 165 lb 12.8 oz (75.2 kg)    BP 130/82   Pulse 60   Temp (!) 97.1 F (36.2 C)   Ht 5\' 6"  (1.676 m)   Wt 170 lb 2 oz (77.2 kg)   BMI 27.46 kg/m  Constitutional:  Pleasant, in no acute distress. Psychiatric: Normal mood and affect. Behavior is normal. EENT: Pupils normal.  Conjunctivae are normal. No scleral icterus. Neck supple. No cervical LAD. Cardiovascular: Normal rate, regular rhythm. No edema Pulmonary/chest: Effort normal and breath sounds normal. No wheezing, rales or rhonchi. Abdominal: Soft, nondistended, nontender. Bowel sounds active throughout. There are no masses palpable. No hepatomegaly. Neurological: Alert and oriented to person place and time. Skin: Skin is  warm and dry. No rashes noted.   ASSESSMENT AND PLAN;   1) Colon cancer screening 2) Family history of colon cancer 3) Family history of colon polyps Due for age-appropriate colon cancer screening.  Family history notable for MGF with colon cancer in mother with colon polyps. -Schedule colonoscopy for routine screening  4) Diarrhea Suspect related to prior cholecystectomy, although could have superimposed exacerbation given gastric sleeve/SIPS at that same time.  Unfortunately trial of cholestyramine was complicated by constipation. -Plan for random directed biopsies at time of colonoscopy to rule out IBD, Microscopic Colitis, additional mucosal/luminal pathology -EGD with small bowel  biopsies -If no etiology, can discuss alternate medical management, to include low-dose Questran, fiber supplementation, Imodium/Lomotil trial, etc.  5) Family history of Esophageal cancer: -Planning for EGD as above.  She is otherwise without reflux symptoms, dysphagia, B symptoms  The indications, risks, and benefits of EGD and colonoscopy were explained to the patient in detail. Risks include but are not limited to bleeding, perforation, adverse reaction to medications, and cardiopulmonary compromise. Sequelae include but are not limited to the possibility of surgery, hositalization, and mortality. The patient verbalized understanding and wished to proceed. All questions answered, referred to scheduler and bowel prep ordered. Further recommendations pending results of the exam.     Lavena Bullion, DO, FACG  09/13/2019, 9:20 AM   Verlena Marlette, Garvin Fila, DO

## 2019-09-13 NOTE — Patient Instructions (Signed)
You have been scheduled for an endoscopy and colonoscopy. Please follow the written instructions given to you at your visit today. Please pick up your prep supplies at the pharmacy within the next 1-3 days. If you use inhalers (even only as needed), please bring them with you on the day of your procedure. Your physician has requested that you go to www.startemmi.com and enter the access code given to you at your visit today. This web site gives a general overview about your procedure. However, you should still follow specific instructions given to you by our office regarding your preparation for the procedure.  It was a pleasure to see you today!  Vito Cirigliano, D.O.  

## 2019-10-10 ENCOUNTER — Encounter: Payer: Self-pay | Admitting: Gastroenterology

## 2019-10-10 ENCOUNTER — Other Ambulatory Visit: Payer: Self-pay

## 2019-10-10 ENCOUNTER — Ambulatory Visit (AMBULATORY_SURGERY_CENTER): Payer: 59 | Admitting: Gastroenterology

## 2019-10-10 VITALS — BP 121/69 | HR 52 | Temp 96.6°F | Resp 11 | Ht 66.0 in | Wt 170.0 lb

## 2019-10-10 DIAGNOSIS — K5710 Diverticulosis of small intestine without perforation or abscess without bleeding: Secondary | ICD-10-CM

## 2019-10-10 DIAGNOSIS — Z1211 Encounter for screening for malignant neoplasm of colon: Secondary | ICD-10-CM

## 2019-10-10 DIAGNOSIS — Z800 Family history of malignant neoplasm of digestive organs: Secondary | ICD-10-CM

## 2019-10-10 DIAGNOSIS — K640 First degree hemorrhoids: Secondary | ICD-10-CM | POA: Diagnosis not present

## 2019-10-10 DIAGNOSIS — R197 Diarrhea, unspecified: Secondary | ICD-10-CM

## 2019-10-10 DIAGNOSIS — K2950 Unspecified chronic gastritis without bleeding: Secondary | ICD-10-CM

## 2019-10-10 DIAGNOSIS — K259 Gastric ulcer, unspecified as acute or chronic, without hemorrhage or perforation: Secondary | ICD-10-CM | POA: Diagnosis not present

## 2019-10-10 DIAGNOSIS — K2970 Gastritis, unspecified, without bleeding: Secondary | ICD-10-CM

## 2019-10-10 DIAGNOSIS — B9681 Helicobacter pylori [H. pylori] as the cause of diseases classified elsewhere: Secondary | ICD-10-CM

## 2019-10-10 DIAGNOSIS — Z8371 Family history of colonic polyps: Secondary | ICD-10-CM | POA: Diagnosis not present

## 2019-10-10 MED ORDER — SUCRALFATE 1 GM/10ML PO SUSP: 1.0000 g | Freq: Two times a day (BID) | ORAL | 1 refills | Status: DC

## 2019-10-10 MED ORDER — SODIUM CHLORIDE 0.9 % IV SOLN: 500.0000 mL | Freq: Once | INTRAVENOUS | Status: DC

## 2019-10-10 MED ORDER — PANTOPRAZOLE SODIUM 40 MG PO TBEC: 40.0000 mg | DELAYED_RELEASE_TABLET | Freq: Two times a day (BID) | ORAL | 1 refills | Status: DC

## 2019-10-10 NOTE — Op Note (Signed)
[facility] Patient Name: Wendy Cummings Procedure Date: 10/10/2019 10:19 AM MRN: [id number] Endoscopist: Doristine Locks , MD Age: 49 Referring MD:  Date of Birth: 1971/01/08 Gender: Female Account #: [id number] Procedure:                Colonoscopy Indications:              Screening for colorectal malignant neoplasm, This                            is the patient's first colonoscopy                           Family history notable for mother with colon polyps                            requiring short interval surveillance (path                            unknown), and MGF with CRC. Additionally, she has                            had diarrhea since approximately 2018. Medicines:                Monitored Anesthesia Care Procedure:                Pre-Anesthesia Assessment:                           - Prior to the procedure, a History and Physical                            was performed, and patient medications and                            allergies were reviewed. The patient's tolerance of                            previous anesthesia was also reviewed. The risks                            and benefits of the procedure and the sedation                            options and risks were discussed with the patient.                            All questions were answered, and informed consent                            was obtained. Prior Anticoagulants: The patient has                            taken no previous anticoagulant or antiplatelet  agents. ASA Grade Assessment: II - A patient with                            mild systemic disease. After reviewing the risks                            and benefits, the patient was deemed in                            satisfactory condition to undergo the procedure.                           After obtaining informed consent, the colonoscope                            was passed under direct vision.  Throughout the                            procedure, the patient's blood pressure, pulse, and                            oxygen saturations were monitored continuously. The                            Colonoscope was introduced through the anus and                            advanced to the the terminal ileum. The colonoscopy                            was performed without difficulty. The patient                            tolerated the procedure well. Scope In: 10:20:29 AM Scope Out: 10:36:56 AM Scope Withdrawal Time: 0 hours 13 minutes 53 seconds  Total Procedure Duration: 0 hours 16 minutes 27 seconds  Findings:                 The perianal and digital rectal examinations were                            normal.                           Normal mucosa was found in the entire colon.                            Biopsies for histology were taken with a cold                            forceps from the right colon and left colon for                            evaluation of microscopic colitis. Estimated blood  loss was minimal.                           Non-bleeding internal hemorrhoids were found during                            retroflexion. The hemorrhoids were small.                           The terminal ileum appeared normal. Complications:            No immediate complications. Estimated Blood Loss:     Estimated blood loss was minimal. Impression:               - Normal mucosa in the entire examined colon.                            Biopsied.                           - Non-bleeding internal hemorrhoids.                           - The examined portion of the ileum was normal. Recommendation:           - Patient has a contact number available for                            emergencies. The signs and symptoms of potential                            delayed complications were discussed with the                            patient. Return to normal activities  tomorrow.                            Written discharge instructions were provided to the                            patient.                           - Resume previous diet.                           - Continue present medications.                           - Await pathology results.                           - Repeat colonoscopy in 5 years for screening                            purposes due to family history.                           -  Return to GI clinic at appointment to be                            scheduled. Doristine Locks, MD 10/10/2019 10:48:21 AM

## 2019-10-10 NOTE — Op Note (Signed)
[facility] Patient Name: Wendy Cummings Procedure Date: 10/10/2019 10:06 AM MRN: [id number] Endoscopist: Gerrit Heck , MD Age: 49 Referring MD:  Date of Birth: 12-28-1970 Gender: Female Account #: [id number] Procedure:                Upper GI endoscopy Indications:              Diarrhea, Family history of esophageal cancer                            (father) Medicines:                Monitored Anesthesia Care Procedure:                Pre-Anesthesia Assessment:                           - Prior to the procedure, a History and Physical                            was performed, and patient medications and                            allergies were reviewed. The patient's tolerance of                            previous anesthesia was also reviewed. The risks                            and benefits of the procedure and the sedation                            options and risks were discussed with the patient.                            All questions were answered, and informed consent                            was obtained. Prior Anticoagulants: The patient has                            taken no previous anticoagulant or antiplatelet                            agents. ASA Grade Assessment: II - A patient with                            mild systemic disease. After reviewing the risks                            and benefits, the patient was deemed in                            satisfactory condition to undergo the procedure.  After obtaining informed consent, the endoscope was                            passed under direct vision. Throughout the                            procedure, the patient's blood pressure, pulse, and                            oxygen saturations were monitored continuously. The                            Endoscope was introduced through the mouth, and                            advanced to the second part of duodenum. The upper                             GI endoscopy was accomplished without difficulty.                            The patient tolerated the procedure well. Scope In: Scope Out: Findings:                 The examined esophagus was normal.                           The Z-line was regular and was found 38 cm from the                            incisors.                           Diffuse moderate inflammation characterized by                            congestion (edema), erosions, erythema and shallow                            ulcerations was found in the entire examined                            stomach. There was mucosal friability with contact                            oozing in the gastric body. Biopsies were taken                            with a cold forceps for histology. Estimated blood                            loss was minimal. Observed the mucosa for                            approximately 3  minutes with complete cessation of                            bleeding.                           The duodenal bulb, first portion of the duodenum                            and second portion of the duodenum were normal.                            Biopsies for histology were taken with a cold                            forceps for evaluation of celiac disease. Estimated                            blood loss was minimal.                           A 3 mm diverticulum was found in the duodenal bulb. Complications:            No immediate complications. Estimated Blood Loss:     Estimated blood loss was minimal. Impression:               - Normal esophagus.                           - Z-line regular, 38 cm from the incisors.                           - Gastritis. Biopsied.                           - Normal duodenal bulb, first portion of the                            duodenum and second portion of the duodenum.                            Biopsied.                           - Duodenal  diverticulum. Recommendation:           - Await pathology results.                           - Use Protonix (pantoprazole) 40 mg PO BID for 8                            weeks to promote mucosal healing, then reduce to 40                            mg/daily for continued gastric prophylaxis until  repeat endoscopy.                           - Use sucralfate suspension 1 gram PO BID for 4                            weeks.                           - Return to GI clinic at appointment to be                            scheduled.                           - Repeat upper endoscopy in 8 weeks to check                            healing.                           - Avoid all NSAIDs.                           - Perform a colonoscopy today. Doristine Locks, MD 10/10/2019 10:44:23 AM

## 2019-10-10 NOTE — Progress Notes (Signed)
To PACU, vss. Report to [email or web address] 

## 2019-10-10 NOTE — Patient Instructions (Signed)
Information on gastritis and hemorrhoids given to you today.  Await pathology results.  Use Protonix 40 mg by mouth twice a day for 8 weeks to promote healing.  Use Carafate suspension 1 gram by mouth twice a day for 4 weeks.  Avoid all NSAIDS.  Repeat colonoscopy in 5 years.  YOU HAD AN ENDOSCOPIC PROCEDURE TODAY AT THE  ENDOSCOPY CENTER:   Refer to the procedure report that was given to you for any specific questions about what was found during the examination.  If the procedure report does not answer your questions, please call your gastroenterologist to clarify.  If you requested that your care partner not be given the details of your procedure findings, then the procedure report has been included in a sealed envelope for you to review at your convenience later.  YOU SHOULD EXPECT: Some feelings of bloating in the abdomen. Passage of more gas than usual.  Walking can help get rid of the air that was put into your GI tract during the procedure and reduce the bloating. If you had a lower endoscopy (such as a colonoscopy or flexible sigmoidoscopy) you may notice spotting of blood in your stool or on the toilet paper. If you underwent a bowel prep for your procedure, you may not have a normal bowel movement for a few days.  Please Note:  You might notice some irritation and congestion in your nose or some drainage.  This is from the oxygen used during your procedure.  There is no need for concern and it should clear up in a day or so.  SYMPTOMS TO REPORT IMMEDIATELY:   Following lower endoscopy (colonoscopy or flexible sigmoidoscopy):  Excessive amounts of blood in the stool  Significant tenderness or worsening of abdominal pains  Swelling of the abdomen that is new, acute  Fever of 100F or higher   Following upper endoscopy (EGD)  Vomiting of blood or coffee ground material  New chest pain or pain under the shoulder blades  Painful or persistently difficult swallowing  New  shortness of breath  Fever of 100F or higher  Black, tarry-looking stools  For urgent or emergent issues, a gastroenterologist can be reached at any hour by calling [phone]. Do not use MyChart messaging for urgent concerns.    DIET:  We do recommend a small meal at first, but then you may proceed to your regular diet.  Drink plenty of fluids but you should avoid alcoholic beverages for 24 hours.  ACTIVITY:  You should plan to take it easy for the rest of today and you should NOT DRIVE or use heavy machinery until tomorrow (because of the sedation medicines used during the test).    FOLLOW UP: Our staff will call the number listed on your records 48-72 hours following your procedure to check on you and address any questions or concerns that you may have regarding the information given to you following your procedure. If we do not reach you, we will leave a message.  We will attempt to reach you two times.  During this call, we will ask if you have developed any symptoms of COVID 19. If you develop any symptoms (ie: fever, flu-like symptoms, shortness of breath, cough etc.) before then, please call [phone].  If you test positive for Covid 19 in the 2 weeks post procedure, please call and report this information to [place].    If any biopsies were taken you will be contacted by phone or by letter within the next  1-3 weeks.  Please call us at [phone] if you have not heard about the biopsies in 3 weeks.    SIGNATURES/CONFIDENTIALITY: You and/or your care partner have signed paperwork which will be entered into your electronic medical record.  These signatures attest to the fact that that the information above on your After Visit Summary has been reviewed and is understood.  Full responsibility of the confidentiality of this discharge information lies with you and/or your care-partner.

## 2019-10-10 NOTE — Progress Notes (Signed)
Called to room to assist during endoscopic procedure.  Patient ID and intended procedure confirmed with present staff. Received instructions for my participation in the procedure from the performing physician.  

## 2019-10-10 NOTE — Progress Notes (Signed)
[id number]-JB [id number]-KA  

## 2019-10-11 ENCOUNTER — Telehealth: Payer: Self-pay

## 2019-10-11 NOTE — Telephone Encounter (Signed)
Patient returned call to the office-patient has been scheduled on 11/15/2019 at 10:20 am; Patient advised to call back to the office at [phone] should questions/concerns arise;  Patient verbalized understanding of information/instructions;

## 2019-10-11 NOTE — Telephone Encounter (Signed)
Left message for patient to call back to the office;   4 wk f/u post EGD/colon-diarrhea/family hx of esophageal CA (father)/Cirigliano

## 2019-10-14 ENCOUNTER — Telehealth: Payer: Self-pay | Admitting: *Deleted

## 2019-10-14 NOTE — Telephone Encounter (Signed)
  Follow up Call-  Call back number 10/10/2019  Post procedure Call Back phone  # [phone]  Permission to leave phone message Yes  Some recent data might be hidden     Patient questions:  Do you have a fever, pain , or abdominal swelling? No. Pain Score  0 *  Have you tolerated food without any problems? Yes.    Have you been able to return to your normal activities? Yes.    Do you have any questions about your discharge instructions: Diet   No. Medications  No. Follow up visit  No.  Do you have questions or concerns about your Care? No.  Actions: * If pain score is 4 or above: No action needed, pain <4  1. Have you developed a fever since your procedure? NO  2.   Have you had an respiratory symptoms (SOB or cough) since your procedure? NO  3.   Have you tested positive for COVID 19 since your procedure NO  4.   Have you had any family members/close contacts diagnosed with the COVID 19 since your procedure?  NO   If yes to any of these questions please route to Laverna Peace, RN and Jennye Boroughs, RN.

## 2019-10-16 ENCOUNTER — Other Ambulatory Visit: Payer: Self-pay | Admitting: Gastroenterology

## 2019-10-16 DIAGNOSIS — B9681 Helicobacter pylori [H. pylori] as the cause of diseases classified elsewhere: Secondary | ICD-10-CM

## 2019-10-16 DIAGNOSIS — Z01818 Encounter for other preprocedural examination: Secondary | ICD-10-CM

## 2019-10-16 MED ORDER — BISMUTH SUBSALICYLATE 262 MG PO CHEW: 524.0000 mg | CHEWABLE_TABLET | Freq: Four times a day (QID) | ORAL | 0 refills | Status: AC

## 2019-10-16 MED ORDER — METRONIDAZOLE 250 MG PO TABS: 250.0000 mg | ORAL_TABLET | Freq: Four times a day (QID) | ORAL | 0 refills | Status: AC

## 2019-10-16 MED ORDER — OMEPRAZOLE 40 MG PO CPDR: 40.0000 mg | DELAYED_RELEASE_CAPSULE | Freq: Two times a day (BID) | ORAL | 0 refills | Status: DC

## 2019-10-16 MED ORDER — DOXYCYCLINE HYCLATE 100 MG PO TABS: 100.0000 mg | ORAL_TABLET | Freq: Two times a day (BID) | ORAL | 0 refills | Status: AC

## 2019-10-21 ENCOUNTER — Other Ambulatory Visit: Payer: Self-pay | Admitting: Obstetrics and Gynecology

## 2019-10-21 DIAGNOSIS — R928 Other abnormal and inconclusive findings on diagnostic imaging of breast: Secondary | ICD-10-CM

## 2019-11-07 ENCOUNTER — Encounter: Payer: Self-pay | Admitting: Gastroenterology

## 2019-11-07 ENCOUNTER — Ambulatory Visit
Admission: RE | Admit: 2019-11-07 | Discharge: 2019-11-07 | Disposition: A | Discharge status: Home / Self Care | Payer: 59 | Source: Ambulatory Visit | Attending: Obstetrics and Gynecology | Admitting: Obstetrics and Gynecology

## 2019-11-07 ENCOUNTER — Other Ambulatory Visit: Payer: Self-pay

## 2019-11-07 ENCOUNTER — Other Ambulatory Visit: Payer: Self-pay | Admitting: Obstetrics and Gynecology

## 2019-11-07 DIAGNOSIS — R928 Other abnormal and inconclusive findings on diagnostic imaging of breast: Secondary | ICD-10-CM

## 2019-11-07 DIAGNOSIS — R921 Mammographic calcification found on diagnostic imaging of breast: Secondary | ICD-10-CM

## 2019-11-15 ENCOUNTER — Other Ambulatory Visit: Payer: Self-pay

## 2019-11-15 ENCOUNTER — Ambulatory Visit: Payer: 59 | Admitting: Gastroenterology

## 2019-11-15 ENCOUNTER — Encounter: Payer: Self-pay | Admitting: Gastroenterology

## 2019-11-15 VITALS — BP 120/82 | HR 79 | Temp 97.5°F | Ht 66.0 in | Wt 166.5 lb

## 2019-11-15 DIAGNOSIS — K640 First degree hemorrhoids: Secondary | ICD-10-CM | POA: Diagnosis not present

## 2019-11-15 DIAGNOSIS — M533 Sacrococcygeal disorders, not elsewhere classified: Secondary | ICD-10-CM

## 2019-11-15 DIAGNOSIS — K2970 Gastritis, unspecified, without bleeding: Secondary | ICD-10-CM | POA: Diagnosis not present

## 2019-11-15 DIAGNOSIS — R197 Diarrhea, unspecified: Secondary | ICD-10-CM | POA: Diagnosis not present

## 2019-11-15 DIAGNOSIS — K259 Gastric ulcer, unspecified as acute or chronic, without hemorrhage or perforation: Secondary | ICD-10-CM

## 2019-11-15 DIAGNOSIS — B9681 Helicobacter pylori [H. pylori] as the cause of diseases classified elsewhere: Secondary | ICD-10-CM

## 2019-11-15 NOTE — Patient Instructions (Signed)
If you are age 51 or older, your body mass index should be between 23-30. Your Body mass index is 26.87 kg/m. If this is out of the aforementioned range listed, please consider follow up with your Primary Care Provider.  If you are age 63 or younger, your body mass index should be between 19-25. Your Body mass index is 26.87 kg/m. If this is out of the aformentioned range listed, please consider follow up with your Primary Care Provider.   You have been scheduled for an endoscopy. Please follow written instructions given to you at your visit today. If you use inhalers (even only as needed), please bring them with you on the day of your procedure. Your physician has requested that you go to www.startemmi.com and enter the access code given to you at your visit today. This web site gives a general overview about your procedure. However, you should still follow specific instructions given to you by our office regarding your preparation for the procedure.  Please purchase the following medications over the counter and take as directed: Use fiber supplement (Ex. Citracel or Benefiber)   It was a pleasure to see you today!  Vito Cirigliano, D.O.

## 2019-11-15 NOTE — Progress Notes (Signed)
P  Chief Complaint:    H. pylori gastritis, procedure follow-up  GI History: 49 year old female with a history of SIPS/gastric sleeve and ccy in 2018, initially seen in the GI clinic on 09/13/2019 for evaluation of initial CRC screening and evaluation of diarrhea.  Diarrhea has been present since GI surgery above.  Stools always loose then go to watery. Has trialed cholestyramine- c/b constipation.  No abdominal pain. Hasn't trialed fiber supplement.   Hx of shingles in left eye. Will have intemittent episodes of b/l scalp pain followed by diarrhea. Has since stopped all medications for this. Following with [facility] Ophthamologist/Allergy Eye specialist.  Family history notable for: -Father with Esophageal CA now s/p Chemo/XRT and esophagectomy -MGF with CRC -Mother with polyps  Endoscopic history: -EGD (10/2019, Dr. Barron Alvine): Normal esophagus, gastritis with gastric erosions (H. pylori positive), normal duodenum with normal biopsies, duodenal diverticulum -Colonoscopy (10/2019, Dr. Barron Alvine): Normal (biopsies negative for [facility]), normal TI.  Internal hemorrhoids.  Repeat in 5 years due to family history  HPI:     Patient is a 49 y.o. female presenting to the Gastroenterology Clinic for follow-up.  EGD on 10/10/19 with H. pylori gastritis and gastric erosions.  Treated with high-dose PPI x8 weeks and Carafate x4 weeks along with quadruple therapy for H. pylori, with plan for repeat EGD at 8 weeks.  Colonoscopy on 10/10/2019 notable for internal hemorrhoids, otherwise normal.  Today, she states diarrhea still present. Tolerated Abx without issue; still on PPI therapy.  Scheduled for repeat EGD to confirm eradication next week.  Hemorrhoids noted at the time of colonoscopy, with consideration for hemorrhoid banding.  However, these are largely asymptomatic and not bothersome, therefore no plan for banding.  Separately, she does have pain around her coccyx.  Worse with prolonged sitting.  It  does not bother her walking around.  No dyschezia.  No associated abdominal symptoms, fever, chills.  Has not trialed any medications.  Started approximately 08/2019.    Review of systems:     No chest pain, no SOB, no fevers, no urinary sx   Past Medical History:  Diagnosis Date  . Anemia   . Arrhythmia   . GERD (gastroesophageal reflux disease)   . History of shingles    left eye  . Leg pain, bilateral   . Leg swelling   . Obesity     Patient's surgical history, family medical history, social history, medications and allergies were all reviewed in Epic    Current Outpatient Medications  Medication Sig Dispense Refill  . Multiple Vitamin (MULTIVITAMIN) capsule Take 1 capsule by mouth daily.    Marland Kitchen omeprazole (PRILOSEC) 40 MG capsule Take 1 capsule (40 mg total) by mouth 2 (two) times daily for 14 days. Take twice daily for 8 weeks 120 capsule 0  . pantoprazole (PROTONIX) 40 MG tablet Take 1 tablet (40 mg total) by mouth 2 (two) times daily. 60 tablet 1  . prednisoLONE acetate (PRED FORTE) 1 % ophthalmic suspension Place 1 drop into the left eye as needed.    . sucralfate (CARAFATE) 1 GM/10ML suspension Take 10 mLs (1 g total) by mouth 2 (two) times daily. 420 mL 1   No current facility-administered medications for this visit.    Physical Exam:     There were no vitals taken for this visit.  GENERAL:  Pleasant female in NAD PSYCH: : Cooperative, normal affect EENT:  conjunctiva pink, mucous membranes moist Musculoskeletal:  Normal muscle tone, normal strength NEURO: Alert and oriented  x 3, no focal neurologic deficits Rectal: Exam deferred by patient   IMPRESSION and PLAN:    1) H. pylori gastritis 2) Gastric erosions -Has completed 2-week course of quadruple therapy - Stop omeprazole (no need for dual PPI) - Resume Protonix and carafate - EGD with gastric biopsies next week to confirm eradication  3) Coccydynia: -Clinical presentation seems most consistent with  coccydynia.  Discussed trial of NSAIDs, but given prior gastric surgery and recent gastric ulcer/gastritis, she would like to avoid -We will discuss with her PCM referral for possible OMT -Otherwise, can consider sitting on a doughnut, avoid prolonged sitting, etc. as conservative measures  4) Chronic diarrhea: -Suspect related to prior cholecystectomy.  Cholestyramine complicated by constipation, so does not want to retrial medications -Start fiber supplement such as Citrucel or Benefiber -If no improvement, could consider another trial of med management  5.)  Internal hemorrhoids -Incidentally noted at the time of colonoscopy.  Otherwise asymptomatic.  No plan for hemorrhoid banding at this time  I spent 30 minutes of time, including in depth chart review, independent review of results as outlined above, communicating results with the patient directly, face-to-face time with the patient, coordinating care, and ordering studies and medications as appropriate, and documentation.  The indications, risks, and benefits of EGD were explained to the patient in detail. Risks include but are not limited to bleeding, perforation, adverse reaction to medications, and cardiopulmonary compromise. Sequelae include but are not limited to the possibility of surgery, hositalization, and mortality. The patient verbalized understanding and wished to proceed. All questions answered, referred to scheduler. Further recommendations pending results of the exam.         Lavena Bullion ,DO, FACG 11/15/2019, 10:38 AM

## 2019-11-21 ENCOUNTER — Telehealth: Payer: Self-pay

## 2019-11-21 NOTE — Telephone Encounter (Signed)
Patient is scheduled for a endoscopy with Dr. Barron Alvine on 4/16. Was unable to confirm if patient had covid vaccine, appt notes says the covid test was canceled. Tried to call patient to get confirmation, but was unable to reach patient. Left voicemail to have patient call office back.

## 2019-11-22 ENCOUNTER — Ambulatory Visit (AMBULATORY_SURGERY_CENTER): Payer: 59 | Admitting: Gastroenterology

## 2019-11-22 ENCOUNTER — Encounter: Payer: Self-pay | Admitting: Gastroenterology

## 2019-11-22 ENCOUNTER — Other Ambulatory Visit: Payer: Self-pay

## 2019-11-22 VITALS — BP 119/71 | HR 62 | Temp 95.9°F | Resp 12 | Ht 66.0 in | Wt 166.0 lb

## 2019-11-22 DIAGNOSIS — K2970 Gastritis, unspecified, without bleeding: Secondary | ICD-10-CM

## 2019-11-22 DIAGNOSIS — Z9884 Bariatric surgery status: Secondary | ICD-10-CM

## 2019-11-22 DIAGNOSIS — K3189 Other diseases of stomach and duodenum: Secondary | ICD-10-CM | POA: Diagnosis not present

## 2019-11-22 DIAGNOSIS — K2950 Unspecified chronic gastritis without bleeding: Secondary | ICD-10-CM | POA: Diagnosis not present

## 2019-11-22 DIAGNOSIS — R197 Diarrhea, unspecified: Secondary | ICD-10-CM

## 2019-11-22 DIAGNOSIS — B9681 Helicobacter pylori [H. pylori] as the cause of diseases classified elsewhere: Secondary | ICD-10-CM

## 2019-11-22 MED ORDER — SODIUM CHLORIDE 0.9 % IV SOLN: 500.0000 mL | Freq: Once | INTRAVENOUS | Status: DC

## 2019-11-22 NOTE — Op Note (Signed)
[facility] Patient Name: Wendy Cummings Procedure Date: 11/22/2019 9:39 AM MRN: [id number] Endoscopist: Doristine Locks , MD Age: 49 Referring MD:  Date of Birth: 06/12/1971 Gender: Female Account #: [id number] Procedure:                Upper GI endoscopy Indications:              Follow-up of Helicobacter pylori, Follow-up of                            gastritis, Diarrhea                           EGD in 10/2019 with gastritis, gastric erosions, and                            H pylori, now s/p quadruple therapy, presents to                            assess for mucosal healing and H pylori                            eradication. Additionally with diarrhea. Recent                            colonoscopy otherwise unrevealing. Medicines:                Monitored Anesthesia Care Procedure:                Pre-Anesthesia Assessment:                           - Prior to the procedure, a History and Physical                            was performed, and patient medications and                            allergies were reviewed. The patient's tolerance of                            previous anesthesia was also reviewed. The risks                            and benefits of the procedure and the sedation                            options and risks were discussed with the patient.                            All questions were answered, and informed consent                            was obtained. Prior Anticoagulants: The patient has  taken no previous anticoagulant or antiplatelet                            agents. ASA Grade Assessment: II - A patient with                            mild systemic disease. After reviewing the risks                            and benefits, the patient was deemed in                            satisfactory condition to undergo the procedure.                           After obtaining informed consent, the endoscope was                             passed under direct vision. Throughout the                            procedure, the patient's blood pressure, pulse, and                            oxygen saturations were monitored continuously. The                            Endoscope was introduced through the mouth, and                            advanced to the jejunum. The upper GI endoscopy was                            accomplished without difficulty. The patient                            tolerated the procedure well. Scope In: Scope Out: Findings:                 The examined esophagus was normal.                           The Z-line was regular and was found 36 cm from the                            incisors.                           Evidence of a sleeve gastrectomy was found in the                            gastric fundus and in the gastric body. This was                            characterized  by erythema. Biopsies were taken with                            a cold forceps for Helicobacter pylori testing.                            Estimated blood loss was minimal.                           Mildly erythematous mucosa without bleeding was                            found in the gastric body and in the gastric                            antrum. The previously noted erosions have since                            healed. The gastritis was improved from previous,                            but not resolved. Biopsies were taken with a cold                            forceps for Helicobacter pylori testing. Estimated                            blood loss was minimal.                           There was evidence of a widely patent duodenal                            switch in the duodenal bulb. Anatomy consistent                            with gastric sleeve and SIPS. This was                            characterized by healthy appearing mucosa. Both                            limbs were explored  endoscopically.                           Normal mucosa was found in the visualized duodenum                            and jejunum. Biopsies for histology were taken with                            a cold forceps for evaluation of celiac disease.  Estimated blood loss was minimal. Complications:            No immediate complications. Estimated Blood Loss:     Estimated blood loss was minimal. Impression:               - Normal esophagus.                           - Z-line regular, 36 cm from the incisors.                           - A sleeve gastrectomy was found, characterized by                            erythema. Biopsied.                           - Erythematous mucosa in the gastric body and                            antrum. Biopsied.                           - Widely patent anastamosis from prior SIPS                            procedure. The anastamosis was characterized by                            healthy appearing mucosa was found.                           - Normal mucosa was found in the visualized jejunum                            and duodenum. Biopsied. Recommendation:           - Patient has a contact number available for                            emergencies. The signs and symptoms of potential                            delayed complications were discussed with the                            patient. Return to normal activities tomorrow.                            Written discharge instructions were provided to the                            patient.                           - Resume previous diet.                           -  Continue present medications.                           - Await pathology results.                           - Return to GI clinic in 2 months. Doristine Locks, MD 11/22/2019 10:18:55 AM

## 2019-11-22 NOTE — Progress Notes (Signed)
PT taken to PACU. Monitors in place. VSS. Report given to RN. 

## 2019-11-22 NOTE — Progress Notes (Signed)
Called to room to assist during endoscopic procedure.  Patient ID and intended procedure confirmed with present staff. Received instructions for my participation in the procedure from the performing physician.  

## 2019-11-22 NOTE — Patient Instructions (Signed)
YOU HAD AN ENDOSCOPIC PROCEDURE TODAY AT THE [facility]:   Refer to the procedure report that was given to you for any specific questions about what was found during the examination.  If the procedure report does not answer your questions, please call your gastroenterologist to clarify.  If you requested that your care partner not be given the details of your procedure findings, then the procedure report has been included in a sealed envelope for you to review at your convenience later.  YOU SHOULD EXPECT: Some feelings of bloating in the abdomen. Passage of more gas than usual.  Walking can help get rid of the air that was put into your GI tract during the procedure and reduce the bloating. If you had a lower endoscopy (such as a colonoscopy or flexible sigmoidoscopy) you may notice spotting of blood in your stool or on the toilet paper. If you underwent a bowel prep for your procedure, you may not have a normal bowel movement for a few days.  Please Note:  You might notice some irritation and congestion in your nose or some drainage.  This is from the oxygen used during your procedure.  There is no need for concern and it should clear up in a day or so.  SYMPTOMS TO REPORT IMMEDIATELY:     Following upper endoscopy (EGD)  Vomiting of blood or coffee ground material  New chest pain or pain under the shoulder blades  Painful or persistently difficult swallowing  New shortness of breath  Fever of 100F or higher  Black, tarry-looking stools  For urgent or emergent issues, a gastroenterologist can be reached at any hour by calling [phone]. Do not use MyChart messaging for urgent concerns.    DIET:  We do recommend a small meal at first, but then you may proceed to your regular diet.  Drink plenty of fluids but you should avoid alcoholic beverages for 24 hours.  ACTIVITY:  You should plan to take it easy for the rest of today and you should NOT DRIVE or use heavy machinery  until tomorrow (because of the sedation medicines used during the test).    FOLLOW UP: Our staff will call the number listed on your records 48-72 hours following your procedure to check on you and address any questions or concerns that you may have regarding the information given to you following your procedure. If we do not reach you, we will leave a message.  We will attempt to reach you two times.  During this call, we will ask if you have developed any symptoms of COVID 19. If you develop any symptoms (ie: fever, flu-like symptoms, shortness of breath, cough etc.) before then, please call [phone].  If you test positive for Covid 19 in the 2 weeks post procedure, please call and report this information to [place].    If any biopsies were taken you will be contacted by phone or by letter within the next 1-3 weeks.  Please call us at [phone] if you have not heard about the biopsies in 3 weeks.    SIGNATURES/CONFIDENTIALITY: You and/or your care partner have signed paperwork which will be entered into your electronic medical record.  These signatures attest to the fact that that the information above on your After Visit Summary has been reviewed and is understood.  Full responsibility of the confidentiality of this discharge information lies with you and/or your care-partner.   Resume medications. Follow up in clinic in 2 months.

## 2019-11-22 NOTE — Progress Notes (Signed)
Pt's states no medical or surgical changes since previsit or office visit.   CW vitals, SH Iv and JB temp. 

## 2019-11-26 ENCOUNTER — Telehealth: Payer: Self-pay

## 2019-11-26 NOTE — Telephone Encounter (Signed)
  Follow up Call-  Call back number 11/22/2019 10/10/2019  Post procedure Call Back phone  # [phone]  Permission to leave phone message Yes Yes  Some recent data might be hidden     Patient questions:  Do you have a fever, pain , or abdominal swelling? No. Pain Score  0 *  Have you tolerated food without any problems? Yes.    Have you been able to return to your normal activities? Yes.    Do you have any questions about your discharge instructions: Diet   No. Medications  No. Follow up visit  No.  Do you have questions or concerns about your Care? No.  Actions: * If pain score is 4 or above: No action needed, pain <4.  1. Have you developed a fever since your procedure? no  2.   Have you had an respiratory symptoms (SOB or cough) since your procedure? no  3.   Have you tested positive for COVID 19 since your procedure no  4.   Have you had any family members/close contacts diagnosed with the COVID 19 since your procedure?  no   If yes to any of these questions please route to Laverna Peace, RN and Charlett Lango, RN

## 2020-03-23 ENCOUNTER — Encounter: Payer: Self-pay | Admitting: Family Medicine

## 2020-03-23 DIAGNOSIS — M533 Sacrococcygeal disorders, not elsewhere classified: Secondary | ICD-10-CM

## 2020-04-06 NOTE — Progress Notes (Signed)
[facility] [address] [place] [address] Phone: [phone] Subjective:   I Wendy Cummings am serving as a Neurosurgeon for Dr. Antoine Cummings.  This visit occurred during the SARS-CoV-2 public health emergency.  Safety protocols were in place, including screening questions prior to the visit, additional usage of staff PPE, and extensive cleaning of exam room while observing appropriate contact time as indicated for disinfecting solutions.   I'm seeing this patient by the request  of:  Wendy Cummings, Wendy Lesch, DO  CC: Patient has some coccyx pain  [id number]  Wendy Cummings is a 49 y.o. female coming in with complaint of coccyx pain. Patient states the pain is chronic. States that it may be inflammation. Pain has gotten worse over time. Has tried heat and doughnut. Sitting for long periods of time is painful as well as standing in high heels. States the pain is deep. 5/10 at its worse.      Past Medical History:  Diagnosis Date  . Anemia   . Arrhythmia   . GERD (gastroesophageal reflux disease)   . History of shingles    left eye  . Leg pain, bilateral   . Leg swelling   . Obesity    Past Surgical History:  Procedure Laterality Date  . BARIATRIC SURGERY  01/2017   gastric sleeve   . CHOLECYSTECTOMY    . PLANTAR FASCIA SURGERY  2015  . TONSILLECTOMY     Social History   Socioeconomic History  . Marital status: Married    Spouse name: Not on file  . Number of children: Not on file  . Years of education: Not on file  . Highest education level: Not on file  Occupational History  . Not on file  Tobacco Use  . Smoking status: Former Smoker    Packs/day: 0.50    Types: Cigarettes    Quit date: 01/31/2012    Years since quitting: 8.1  . Smokeless tobacco: Never Used  Vaping Use  . Vaping Use: Never used  Substance and Sexual Activity  . Alcohol use: Yes  . Drug use: No  . Sexual activity: Not on file  Other Topics Concern  .  Not on file  Social History Narrative  . Not on file   Social Determinants of Health   Financial Resource Strain:   . Difficulty of Paying Living Expenses: Not on file  Food Insecurity:   . Worried About Programme researcher, broadcasting/film/video in the Last Year: Not on file  . Ran Out of Food in the Last Year: Not on file  Transportation Needs:   . Lack of Transportation (Medical): Not on file  . Lack of Transportation (Non-Medical): Not on file  Physical Activity:   . Days of Exercise per Week: Not on file  . Minutes of Exercise per Session: Not on file  Stress:   . Feeling of Stress : Not on file  Social Connections:   . Frequency of Communication with Friends and Family: Not on file  . Frequency of Social Gatherings with Friends and Family: Not on file  . Attends Religious Services: Not on file  . Active Member of Clubs or Organizations: Not on file  . Attends Banker Meetings: Not on file  . Marital Status: Not on file   Allergies  Allergen Reactions  . Tetanus Toxoid     REACTION: SWELLING   Family History  Problem Relation Age of Onset  . Colon polyps Mother   .  Esophageal cancer Father   . Prostate cancer Father   . Colon cancer Maternal Grandfather   . Rectal cancer Neg Hx   . Stomach cancer Neg Hx        Current Outpatient Medications (Analgesics):  .  colchicine 0.6 MG tablet, Take 1 tablet (0.6 mg total) by mouth daily.   Current Outpatient Medications (Other):  Marland Kitchen  Multiple Vitamin (MULTIVITAMIN) capsule, Take 1 capsule by mouth daily. .  prednisoLONE acetate (PRED FORTE) 1 % ophthalmic suspension, Place 1 drop into the left eye as needed. .  gabapentin (NEURONTIN) 100 MG capsule, Take 2 capsules (200 mg total) by mouth at bedtime. .  Vitamin D, Ergocalciferol, (DRISDOL) 1.25 MG (50000 UNIT) CAPS capsule, Take 1 capsule (50,000 Units total) by mouth every 7 (seven) days.   Reviewed prior external information including notes and imaging from  primary care  provider As well as notes that were available from care everywhere and other healthcare systems.  Past medical history, social, surgical and family history all reviewed in electronic medical record.  No pertanent information unless stated regarding to the chief complaint.   Review of Systems:  No headache, visual changes, nausea, vomiting, diarrhea, constipation, dizziness, abdominal pain, skin rash, fevers, chills, night sweats, weight loss, swollen lymph nodes, body aches, joint swelling, chest pain, shortness of breath, mood changes. POSITIVE muscle aches  Objective  Blood pressure 118/82, pulse (!) 56, height 5\' 6"  (1.676 m), weight 171 lb (77.6 kg), SpO2 97 %.   General: No apparent distress alert and oriented x3 mood and affect normal, dressed appropriately.  HEENT: Pupils equal, extraocular movements intact  Respiratory: Patient's speak in full sentences and does not appear short of breath  Cardiovascular: No lower extremity edema, non tender, no erythema  Neuro: Cranial nerves II through XII are intact, neurovascularly intact in all extremities with 2+ DTRs and 2+ pulses.  Gait normal with good balance and coordination.  MSK:  tender with full range of motion and good stability and symmetric strength and tone of shoulders, elbows, wrist, hip, knee and ankles bilaterally.  Low back exam shows the patient does have no significant tightness but maybe some mild loss of lordosis.  Patient has a negative .  Severely tender over the coccyx midline only.  No masses appreciated.  Neurovascularly intact distally with 5 out of 5 strength in lower extremities   Impression and Recommendations:     The above documentation has been reviewed and is accurate and complete Pearlean Brownie, DO       Note: This dictation was prepared with Dragon dictation along with smaller phrase technology. Any transcriptional errors that result from this process are unintentional.

## 2020-04-07 ENCOUNTER — Other Ambulatory Visit: Payer: Self-pay

## 2020-04-07 ENCOUNTER — Ambulatory Visit (INDEPENDENT_AMBULATORY_CARE_PROVIDER_SITE_OTHER)
Admission: RE | Admit: 2020-04-07 | Discharge: 2020-04-07 | Disposition: A | Discharge status: Home / Self Care | Payer: 59 | Source: Ambulatory Visit | Attending: Family Medicine | Admitting: Family Medicine

## 2020-04-07 ENCOUNTER — Encounter: Payer: Self-pay | Admitting: Family Medicine

## 2020-04-07 ENCOUNTER — Ambulatory Visit: Payer: 59 | Admitting: Family Medicine

## 2020-04-07 VITALS — BP 118/82 | HR 56 | Ht 66.0 in | Wt 171.0 lb

## 2020-04-07 DIAGNOSIS — M533 Sacrococcygeal disorders, not elsewhere classified: Secondary | ICD-10-CM | POA: Insufficient documentation

## 2020-04-07 MED ORDER — GABAPENTIN 100 MG PO CAPS: 200.0000 mg | ORAL_CAPSULE | Freq: Every day | ORAL | 3 refills | Status: DC

## 2020-04-07 MED ORDER — COLCHICINE 0.6 MG PO TABS: 0.6000 mg | ORAL_TABLET | Freq: Every day | ORAL | 0 refills | Status: DC

## 2020-04-07 MED ORDER — VITAMIN D (ERGOCALCIFEROL) 1.25 MG (50000 UNIT) PO CAPS: 50000.0000 [IU] | ORAL_CAPSULE | ORAL | 0 refills | Status: DC

## 2020-04-07 NOTE — Patient Instructions (Addendum)
Xrays today at [address] location - Take the elevator down to the ground floor for xray Colchicine 2 times a day for 5 days  Gabapentin 200mg  at night Once weekly vitamin D for 12 weeks  Enjoy disney  See me again in 5 weeks

## 2020-04-07 NOTE — Assessment & Plan Note (Signed)
Patient has what appears to be more of an nontraumatic coccydynia.  Patient is having no bowel problems.  No bright red per rectum.  Patient has been otherwise healthy with no significant weight loss.  We discussed different vitamin supplementations that can be beneficial, unable to do prednisone with patient recently having more of herpes on her eye so we will try colchicine which is what had some improvement previously.  Warned of potential side effects with this medication but will be short course.  Patient will avoid direct impact in the area.  Follow-up with me again in 5 weeks for further evaluation and treatment.

## 2020-04-10 ENCOUNTER — Telehealth: Payer: Self-pay

## 2020-04-10 NOTE — Telephone Encounter (Signed)
Patient is calling in regard to her colchicine. Patient was told the prescription would be over 200 dollars. I did a PA on the medication and it was denied. Is there an alterative medication that can be sent in?

## 2020-04-10 NOTE — Telephone Encounter (Signed)
unfortunately it would be prednisone.  If she woul dlike it I would consider 40mg  daily for 5 days  Over the counter she could try tart cherry extract 1200mg  nightly

## 2020-04-14 NOTE — Telephone Encounter (Signed)
Spoke with patient. She is out of town by will start colchicine when she returns. Getting it with a coupon. Also asked about vitamin d refill. Patient has been taking it daily. Told her she is to take one pill a week. Patient voices understanding.

## 2020-05-11 ENCOUNTER — Other Ambulatory Visit: Payer: Self-pay | Admitting: Obstetrics and Gynecology

## 2020-05-11 ENCOUNTER — Ambulatory Visit
Admission: RE | Admit: 2020-05-11 | Discharge: 2020-05-11 | Disposition: A | Discharge status: Home / Self Care | Payer: 59 | Source: Ambulatory Visit | Attending: Obstetrics and Gynecology | Admitting: Obstetrics and Gynecology

## 2020-05-11 ENCOUNTER — Other Ambulatory Visit: Payer: Self-pay

## 2020-05-11 DIAGNOSIS — R921 Mammographic calcification found on diagnostic imaging of breast: Secondary | ICD-10-CM

## 2020-05-14 ENCOUNTER — Ambulatory Visit: Payer: 59 | Admitting: Family Medicine

## 2020-05-14 ENCOUNTER — Other Ambulatory Visit: Payer: Self-pay

## 2020-05-14 ENCOUNTER — Encounter: Payer: Self-pay | Admitting: Family Medicine

## 2020-05-14 DIAGNOSIS — M533 Sacrococcygeal disorders, not elsewhere classified: Secondary | ICD-10-CM | POA: Diagnosis not present

## 2020-05-14 MED ORDER — VITAMIN D (ERGOCALCIFEROL) 1.25 MG (50000 UNIT) PO CAPS: 50000.0000 [IU] | ORAL_CAPSULE | ORAL | 0 refills | Status: DC

## 2020-05-14 NOTE — Patient Instructions (Signed)
Good to see you Overall much better Refilled vitamin D ONCE WEEKLY See me again in 2-3 months

## 2020-05-14 NOTE — Progress Notes (Signed)
[name] [address] [place] [address] Phone: [phone] Subjective:   I Wendy Cummings am serving as a Neurosurgeon for Dr. Antoine Primas.  This visit occurred during the SARS-CoV-2 public health emergency.  Safety protocols were in place, including screening questions prior to the visit, additional usage of staff PPE, and extensive cleaning of exam room while observing appropriate contact time as indicated for disinfecting solutions.   I'm seeing this patient by the request  of:  Wendy Mam, DO  CC: Lower back pain  NAT:[id number]   04/07/2020 Patient has what appears to be more of an nontraumatic coccydynia.  Patient is having no bowel problems.  No bright red per rectum.  Patient has been otherwise healthy with no significant weight loss.  We discussed different vitamin supplementations that can be beneficial, unable to do prednisone with patient recently having more of herpes on her eye so we will try colchicine which is what had some improvement previously.  Warned of potential side effects with this medication but will be short course.  Patient will avoid direct impact in the area.  Follow-up with me again in 5 weeks for further evaluation and treatment.   Update 05/14/2020 Wendy Cummings is a 49 y.o. female coming in with complaint of coccyx pain. Patient states that she is getting better. Pain is not as intense or often as before.  Patient states approximately 80% better.  Still has some discomfort from time to time.  Felt like the vitamin D was helpful but took the once weekly daily for 1 week.  Patient is now taking it once weekly and is still continuing to make improvements.      Past Medical History:  Diagnosis Date  . Anemia   . Arrhythmia   . GERD (gastroesophageal reflux disease)   . History of shingles    left eye  . Leg pain, bilateral   . Leg swelling   . Obesity    Past Surgical History:  Procedure Laterality  Date  . BARIATRIC SURGERY  01/2017   gastric sleeve   . CHOLECYSTECTOMY    . PLANTAR FASCIA SURGERY  2015  . TONSILLECTOMY     Social History   Socioeconomic History  . Marital status: Married    Spouse name: Not on file  . Number of children: Not on file  . Years of education: Not on file  . Highest education level: Not on file  Occupational History  . Not on file  Tobacco Use  . Smoking status: Former Smoker    Packs/day: 0.50    Types: Cigarettes    Quit date: 01/31/2012    Years since quitting: 8.2  . Smokeless tobacco: Never Used  Vaping Use  . Vaping Use: Never used  Substance and Sexual Activity  . Alcohol use: Yes  . Drug use: No  . Sexual activity: Not on file  Other Topics Concern  . Not on file  Social History Narrative  . Not on file   Social Determinants of Health   Financial Resource Strain:   . Difficulty of Paying Living Expenses: Not on file  Food Insecurity:   . Worried About Programme researcher, broadcasting/film/video in the Last Year: Not on file  . Ran Out of Food in the Last Year: Not on file  Transportation Needs:   . Lack of Transportation (Medical): Not on file  . Lack of Transportation (Non-Medical): Not on file  Physical Activity:   .  Days of Exercise per Week: Not on file  . Minutes of Exercise per Session: Not on file  Stress:   . Feeling of Stress : Not on file  Social Connections:   . Frequency of Communication with Friends and Family: Not on file  . Frequency of Social Gatherings with Friends and Family: Not on file  . Attends Religious Services: Not on file  . Active Member of Clubs or Organizations: Not on file  . Attends Banker Meetings: Not on file  . Marital Status: Not on file   Allergies  Allergen Reactions  . Tetanus Toxoid     REACTION: SWELLING   Family History  Problem Relation Age of Onset  . Colon polyps Mother   . Esophageal cancer Father   . Prostate cancer Father   . Colon cancer Maternal Grandfather   . Rectal  cancer Neg Hx   . Stomach cancer Neg Hx          Current Outpatient Medications (Other):  .  gabapentin (NEURONTIN) 100 MG capsule, Take 2 capsules (200 mg total) by mouth at bedtime. .  Multiple Vitamin (MULTIVITAMIN) capsule, Take 1 capsule by mouth daily. .  prednisoLONE acetate (PRED FORTE) 1 % ophthalmic suspension, Place 1 drop into the left eye as needed. .  Vitamin D, Ergocalciferol, (DRISDOL) 1.25 MG (50000 UNIT) CAPS capsule, Take 1 capsule (50,000 Units total) by mouth every 7 (seven) days. .  Vitamin D, Ergocalciferol, (DRISDOL) 1.25 MG (50000 UNIT) CAPS capsule, Take 1 capsule (50,000 Units total) by mouth every 7 (seven) days.   Reviewed prior external information including notes and imaging from  primary care provider As well as notes that were available from care everywhere and other healthcare systems.  Past medical history, social, surgical and family history all reviewed in electronic medical record.  No pertanent information unless stated regarding to the chief complaint.   Review of Systems:  No headache, visual changes, nausea, vomiting, diarrhea, constipation, dizziness, abdominal pain, skin rash, fevers, chills, night sweats, weight loss, swollen lymph nodes, body aches, joint swelling, chest pain, shortness of breath, mood changes. POSITIVE muscle aches  Objective  Blood pressure 100/90, pulse 60, height 5\' 6"  (1.676 m), weight 169 lb (76.7 kg), SpO2 98 %.   General: No apparent distress alert and oriented x3 mood and affect normal, dressed appropriately.  HEENT: Pupils equal, extraocular movements intact  Respiratory: Patient's speak in full sentences and does not appear short of breath  Cardiovascular: No lower extremity edema, non tender, no erythema  Neuro: Cranial nerves II through XII are intact, neurovascularly intact in all extremities with 2+ DTRs and 2+ pulses.  Gait normal with good balance and coordination.  MSK:  Non tender with full range of  motion and good stability and symmetric strength and tone of shoulders, elbows, wrist, hip, knee and ankles bilaterally.  Low back exam minimal tightness with .  Negative straight leg test.  No abnormality or tenderness on palpation today.   Impression and Recommendations:     The above documentation has been reviewed and is accurate and complete Pearlean Brownie, DO

## 2020-05-14 NOTE — Assessment & Plan Note (Signed)
Patient is making significant improvement at this time.  Continue vitamin D.  Encouraged her to try the gabapentin for a little bit longer.  Continue the exercises and avoiding direct impact.  Patient will follow up again in 2 to 3 months and likely will be completely healed at that time.

## 2020-05-21 ENCOUNTER — Ambulatory Visit
Admission: RE | Admit: 2020-05-21 | Discharge: 2020-05-21 | Disposition: A | Discharge status: Home / Self Care | Payer: 59 | Source: Ambulatory Visit | Attending: Obstetrics and Gynecology | Admitting: Obstetrics and Gynecology

## 2020-05-21 ENCOUNTER — Other Ambulatory Visit: Payer: Self-pay

## 2020-05-21 DIAGNOSIS — R921 Mammographic calcification found on diagnostic imaging of breast: Secondary | ICD-10-CM

## 2020-06-08 ENCOUNTER — Ambulatory Visit: Payer: Self-pay | Admitting: Surgery

## 2020-06-08 DIAGNOSIS — N6320 Unspecified lump in the left breast, unspecified quadrant: Secondary | ICD-10-CM

## 2020-06-08 NOTE — H&P (Signed)
History of Present Illness Wendy Cummings. Martavious Hartel MD; 06/08/2020 9:54 PM) The patient is a 49 year old female who presents with a breast mass. Referred by Dr. Harold Hedge for left breast mass  This is a 49 year old female who presents after a six-month close follow-up mammogram revealed an enlarging area of calcifications in the central left breast. She underwent biopsy of this area which revealed a complex sclerosing lesion with calcifications, ductal papilloma. The area measured 9x 3 x 5 mm. She presents now to discuss excision.   No family history of breast cancer No previous breast problems  CLINICAL DATA:Follow-up probably benign left breast calcifications.  EXAM: DIGITAL DIAGNOSTIC UNILATERAL LEFT MAMMOGRAM WITH TOMO AND CAD  COMPARISON:Previous exam(s).  ACR Breast Density Category c: The breast tissue is heterogeneously dense, which may obscure small masses.  FINDINGS: There has been a mild increase in number of tiny punctate calcifications in a small group in the central left breast, slightly medially. The group measures 9 x 5 x 3 mm. No interval findings elsewhere in the breast suspicious for malignancy.  Mammographic images were processed with CAD.  IMPRESSION: 9 mm group of indeterminate calcifications in the central left breast, slightly medially, with a mild increase in number of calcifications.  RECOMMENDATION: Stereotactic guided core needle biopsy of the 9 mm group of calcifications in the central left breast, slightly medially. This has been discussed with the patient and scheduled at 9:30 a.m. on 05/21/2020.  I have discussed the findings and recommendations with the patient. If applicable, a reminder letter will be sent to the patient regarding the next appointment.  BI-RADS CATEGORY 4: Suspicious.   Electronically Signed By: Beckie Salts M.D. On: 05/11/2020 17:02    Problem List/Past Medical Molli Hazard K. Karla Vines, MD; 06/08/2020 9:54 PM) MASS  OF LEFT BREAST ON MAMMOGRAM (N63.20)  Past Surgical History (Damani Rando K. Beckett Maden, MD; 06/08/2020 9:54 PM) Foot Surgery Left. Tonsillectomy  Diagnostic Studies History Wendy Cummings. Mylee Falin, MD; 06/08/2020 9:54 PM) Colonoscopy never Mammogram 1-3 years ago Pap Smear 1-5 years ago  Allergies Michel Bickers, LPN; 80/04/9832 82:50 AM) Tetanus Toxoids Allergies Reconciled  Medication History Michel Bickers, LPN; 53/04/7672 41:93 AM) Acyclovir (400MG  Tablet, Oral) Active. No Current Medications (Taken starting 06/08/2020) Gabapentin (100MG  Capsule, Oral) Active. prednisoLONE Acetate (1% Suspension, Ophthalmic) Active. Vitamin D (Ergocalciferol) (1.25 MG(50000 UT) Capsule, Oral) Active. Medications Reconciled  Social History 13/08/2019. Evalynne Locurto, MD; 06/08/2020 9:54 PM) Alcohol use Occasional alcohol use. Caffeine use Carbonated beverages. No drug use Tobacco use Former smoker.  Family History Wendy Cummings. Anderson Middlebrooks, MD; 06/08/2020 9:54 PM) Colon Cancer Family Members In General. Colon Polyps Brother, Mother. Depressio Sister. Prostate Cancer Father.  Pregnancy / Birth History Wendy Cummings. Makaveli Hoard, MD; 06/08/2020 9:54 PM) Age at menarche 11 years. Contraceptive History Oral contraceptives. Gravida 1 Irregular periods Maternal age 4-20 Para 1  Other Problems 13/08/2019. Costella Schwarz, MD; 06/08/2020 9:54 PM) Back Pain Gastroesophageal Reflux Disease    Vitals Wendy Cummings [facility] LPN; Tresa Endo [facility] AM) 06/08/2020 11:24 AM Weight: 168.8 lb Height: 66in Body Surface Area: 1.86 m Body Mass Index: 27.24 kg/m  Temp.: 59F(Infrared)  Pulse: 91 (Regular)  BP: 116/62(Sitting, Left Arm, Standard)        Physical Exam 73:53 K. Aundrey Elahi MD; 06/08/2020 9:54 PM)  The physical exam findings are as follows: Note:Constitutional: WDWN in NAD, conversant, no obvious deformities; resting comfortably Eyes: Pupils equal, round; sclera anicteric; moist conjunctiva; no lid  lag HENT: Oral mucosa moist; good dentition Neck: No masses palpated, trachea midline;  no thyromegaly Breasts: symmetric; no nipple changes; bilateral fibrocystic changes; no dominant masses; no axillary lymphadenopathy Lungs: CTA bilaterally; normal respiratory effort CV: Regular rate and rhythm; no murmurs; extremities well-perfused with no edema Abd: +bowel sounds, soft, non-tender, no palpable organomegaly; no palpable hernias Musc: Normal gait; no apparent clubbing or cyanosis in extremities Lymphatic: No palpable cervical or axillary lymphadenopathy Skin: Warm, dry; no sign of jaundice Psychiatric - alert and oriented x 4; calm mood and affect    Assessment & Plan Molli Hazard K. Sharronda Schweers MD; 06/08/2020 11:38 AM)  MASS OF LEFT BREAST ON MAMMOGRAM (N63.20) Impression: Complex sclerosing lesion/ intraductal papilloma  Current Plans Schedule for Surgery - Left radioactive seed localized lumpectomy. The surgical procedure has been discussed with the patient. Potential risks, benefits, alternative treatments, and expected outcomes have been explained. All of the patient's questions at this time have been answered. The likelihood of reaching the patient's treatment goal is good. The patient understand the proposed surgical procedure and wishes to proceed.  Wendy Cummings. Corliss Skains, MD, [facility]  General/ Trauma Surgery   06/08/2020 9:55 PM

## 2020-06-09 ENCOUNTER — Ambulatory Visit: Payer: Self-pay | Admitting: Surgery

## 2020-06-09 NOTE — H&P (Signed)
History of Present Illness Wendy Cummings. Nasario Czerniak MD; 06/08/2020 9:54 PM) The patient is a 49 year old female who presents with a breast mass. Referred by Dr. Harold Hedge for left breast mass  This is a 49 year old female who presents after a six-month close follow-up mammogram revealed an enlarging area of calcifications in the central left breast. She underwent biopsy of this area which revealed a complex sclerosing lesion with calcifications, ductal papilloma. The area measured 9x 3 x 5 mm. She presents now to discuss excision.   No family history of breast cancer No previous breast problems  CLINICAL DATA: Follow-up probably benign left breast calcifications.  EXAM: DIGITAL DIAGNOSTIC UNILATERAL LEFT MAMMOGRAM WITH TOMO AND CAD  COMPARISON: Previous exam(s).  ACR Breast Density Category c: The breast tissue is heterogeneously dense, which may obscure small masses.  FINDINGS: There has been a mild increase in number of tiny punctate calcifications in a small group in the central left breast, slightly medially. The group measures 9 x 5 x 3 mm. No interval findings elsewhere in the breast suspicious for malignancy.  Mammographic images were processed with CAD.  IMPRESSION: 9 mm group of indeterminate calcifications in the central left breast, slightly medially, with a mild increase in number of calcifications.  RECOMMENDATION: Stereotactic guided core needle biopsy of the 9 mm group of calcifications in the central left breast, slightly medially. This has been discussed with the patient and scheduled at 9:30 a.m. on 05/21/2020.  I have discussed the findings and recommendations with the patient. If applicable, a reminder letter will be sent to the patient regarding the next appointment.  BI-RADS CATEGORY 4: Suspicious.   Electronically Signed By: Beckie Salts M.D. On: 05/11/2020 17:02    Problem List/Past Medical Molli Hazard K. Javin Nong, MD; 06/08/2020 9:54 PM) MASS  OF LEFT BREAST ON MAMMOGRAM (N63.20)   Past Surgical History (Cintia Gleed K. Sadaf Przybysz, MD; 06/08/2020 9:54 PM) Foot Surgery  Left. Tonsillectomy   Diagnostic Studies History Wendy Cummings. Janziel Hockett, MD; 06/08/2020 9:54 PM) Colonoscopy  never Mammogram  1-3 years ago Pap Smear  1-5 years ago  Allergies Michel Bickers, LPN; 27/0/6237 62:83 AM) Tetanus Toxoids  Allergies Reconciled   Medication History Michel Bickers, LPN; 15/08/7614 07:37 AM) Acyclovir (400MG  Tablet, Oral) Active. No Current Medications (Taken starting 06/08/2020) Gabapentin (100MG  Capsule, Oral) Active. prednisoLONE Acetate (1% Suspension, Ophthalmic) Active. Vitamin D (Ergocalciferol) (1.25 MG(50000 UT) Capsule, Oral) Active. Medications Reconciled  Social History 13/08/2019. Athene Schuhmacher, MD; 06/08/2020 9:54 PM) Alcohol use  Occasional alcohol use. Caffeine use  Carbonated beverages. No drug use  Tobacco use  Former smoker.  Family History Wendy Cummings. Sharifah Champine, MD; 06/08/2020 9:54 PM) Colon Cancer  Family Members In General. Colon Polyps  Brother, Mother. Depression  Sister. Prostate Cancer  Father.  Pregnancy / Birth History Wendy Cummings. Ozetta Flatley, MD; 06/08/2020 9:54 PM) Age at menarche  11 years. Contraceptive History  Oral contraceptives. Gravida  1 Irregular periods  Maternal age  25-20 Para  1  Other Problems 13/08/2019. Dion Parrow, MD; 06/08/2020 9:54 PM) Back Pain  Gastroesophageal Reflux Disease   Vitals Wendy Cummings [facility] LPN; Tresa Endo [facility] AM) 06/08/2020 11:24 AM Weight: 168.8 lb Height: 66in Body Surface Area: 1.86 m Body Mass Index: 27.24 kg/m  Temp.: 26F (Infrared)  Pulse: 91 (Regular)  BP: 116/62(Sitting, Left Arm, Standard)       Physical Exam 85:46 K. Mars Scheaffer MD; 06/08/2020 9:54 PM) The physical exam findings are as follows: Note: Constitutional: WDWN in NAD, conversant, no obvious deformities; resting comfortably  Eyes: Pupils equal, round; sclera anicteric; moist  conjunctiva; no lid lag HENT: Oral mucosa moist; good dentition Neck: No masses palpated, trachea midline; no thyromegaly Breasts: symmetric; no nipple changes; bilateral fibrocystic changes; no dominant masses; no axillary lymphadenopathy Lungs: CTA bilaterally; normal respiratory effort CV: Regular rate and rhythm; no murmurs; extremities well-perfused with no edema Abd: +bowel sounds, soft, non-tender, no palpable organomegaly; no palpable hernias Musc: Normal gait; no apparent clubbing or cyanosis in extremities Lymphatic: No palpable cervical or axillary lymphadenopathy Skin: Warm, dry; no sign of jaundice Psychiatric - alert and oriented x 4; calm mood and affect    Assessment & Plan Molli Hazard K. Irisa Grimsley MD; 06/08/2020 11:38 AM) MASS OF LEFT BREAST ON MAMMOGRAM (N63.20) Impression: Complex sclerosing lesion/ intraductal papilloma Current Plans Schedule for Surgery - Left radioactive seed localized lumpectomy. The surgical procedure has been discussed with the patient. Potential risks, benefits, alternative treatments, and expected outcomes have been explained. All of the patient's questions at this time have been answered. The likelihood of reaching the patient's treatment goal is good. The patient understand the proposed surgical procedure and wishes to proceed.    Wendy Cummings. Corliss Skains, MD, [facility] Surgery  General/ Trauma Surgery   06/09/2020 2:24 PM

## 2020-06-16 ENCOUNTER — Other Ambulatory Visit: Payer: Self-pay | Admitting: Surgery

## 2020-06-16 DIAGNOSIS — N6320 Unspecified lump in the left breast, unspecified quadrant: Secondary | ICD-10-CM

## 2020-07-14 ENCOUNTER — Encounter (HOSPITAL_BASED_OUTPATIENT_CLINIC_OR_DEPARTMENT_OTHER): Payer: Self-pay | Admitting: Surgery

## 2020-07-14 ENCOUNTER — Other Ambulatory Visit: Payer: Self-pay

## 2020-07-17 ENCOUNTER — Other Ambulatory Visit (HOSPITAL_COMMUNITY)
Admission: RE | Admit: 2020-07-17 | Discharge: 2020-07-17 | Disposition: A | Discharge status: Home / Self Care | Payer: 59 | Source: Ambulatory Visit | Attending: Surgery | Admitting: Surgery

## 2020-07-17 DIAGNOSIS — Z20822 Contact with and (suspected) exposure to covid-19: Secondary | ICD-10-CM | POA: Diagnosis not present

## 2020-07-17 DIAGNOSIS — Z01812 Encounter for preprocedural laboratory examination: Secondary | ICD-10-CM | POA: Diagnosis present

## 2020-07-17 LAB — SARS CORONAVIRUS 2 (TAT 6-24 HRS): SARS Coronavirus 2: NEGATIVE

## 2020-07-17 MED ORDER — CHLORHEXIDINE GLUCONATE CLOTH 2 % EX PADS: 6.0000 | MEDICATED_PAD | Freq: Once | CUTANEOUS | Status: DC

## 2020-07-17 NOTE — Progress Notes (Signed)
      Enhanced Recovery after Surgery for Orthopedics Enhanced Recovery after Surgery is a protocol used to improve the stress on your body and your recovery after surgery.  Patient Instructions  . The night before surgery:  o No food after midnight. ONLY clear liquids after midnight  . The day of surgery (if you do NOT have diabetes):  o Drink ONE (1) Pre-Surgery Clear Ensure as directed.   o This drink was given to you during your hospital  pre-op appointment visit. o The pre-op nurse will instruct you on the time to drink the  Pre-Surgery Ensure depending on your surgery time. o Finish the drink at the designated time by the pre-op nurse.  o Nothing else to drink after completing the  Pre-Surgery Clear Ensure.  . The day of surgery (if you have diabetes): o Drink ONE (1) Gatorade 2 (G2) as directed. o This drink was given to you during your hospital  pre-op appointment visit.  o The pre-op nurse will instruct you on the time to drink the   Gatorade 2 (G2) depending on your surgery time. o Color of the Gatorade may vary. Red is not allowed. o Nothing else to drink after completing the  Gatorade 2 (G2).         If you have questions, please contact your surgeon's office.  Patient given soap and instructions, verbalized understanding.  

## 2020-07-20 ENCOUNTER — Other Ambulatory Visit: Payer: Self-pay

## 2020-07-20 ENCOUNTER — Ambulatory Visit
Admission: RE | Admit: 2020-07-20 | Discharge: 2020-07-20 | Disposition: A | Discharge status: Home / Self Care | Payer: 59 | Source: Ambulatory Visit | Attending: Surgery | Admitting: Surgery

## 2020-07-20 DIAGNOSIS — N6320 Unspecified lump in the left breast, unspecified quadrant: Secondary | ICD-10-CM

## 2020-07-21 ENCOUNTER — Ambulatory Visit (HOSPITAL_BASED_OUTPATIENT_CLINIC_OR_DEPARTMENT_OTHER): Payer: 59 | Admitting: Anesthesiology

## 2020-07-21 ENCOUNTER — Encounter (HOSPITAL_BASED_OUTPATIENT_CLINIC_OR_DEPARTMENT_OTHER): Payer: Self-pay | Admitting: Surgery

## 2020-07-21 ENCOUNTER — Ambulatory Visit
Admission: RE | Admit: 2020-07-21 | Discharge: 2020-07-21 | Disposition: A | Discharge status: Home / Self Care | Payer: 59 | Source: Ambulatory Visit | Attending: Surgery | Admitting: Surgery

## 2020-07-21 ENCOUNTER — Ambulatory Visit (HOSPITAL_BASED_OUTPATIENT_CLINIC_OR_DEPARTMENT_OTHER)
Admission: RE | Admit: 2020-07-21 | Discharge: 2020-07-21 | Disposition: A | Discharge status: Home / Self Care | Payer: 59 | Attending: Surgery | Admitting: Surgery

## 2020-07-21 ENCOUNTER — Encounter (HOSPITAL_BASED_OUTPATIENT_CLINIC_OR_DEPARTMENT_OTHER)
Admission: RE | Disposition: A | Discharge status: Home / Self Care | Payer: Self-pay | Source: Home / Self Care | Attending: Surgery

## 2020-07-21 DIAGNOSIS — N6489 Other specified disorders of breast: Secondary | ICD-10-CM | POA: Diagnosis present

## 2020-07-21 DIAGNOSIS — Z87891 Personal history of nicotine dependence: Secondary | ICD-10-CM | POA: Insufficient documentation

## 2020-07-21 DIAGNOSIS — N6320 Unspecified lump in the left breast, unspecified quadrant: Secondary | ICD-10-CM

## 2020-07-21 DIAGNOSIS — N6012 Diffuse cystic mastopathy of left breast: Secondary | ICD-10-CM | POA: Diagnosis not present

## 2020-07-21 HISTORY — DX: Sacrococcygeal disorders, not elsewhere classified: M53.3

## 2020-07-21 HISTORY — PX: BREAST LUMPECTOMY WITH RADIOACTIVE SEED LOCALIZATION: SHX6424

## 2020-07-21 SURGERY — BREAST LUMPECTOMY WITH RADIOACTIVE SEED LOCALIZATION
Anesthesia: General | Site: Breast | Laterality: Left

## 2020-07-21 MED ORDER — MIDAZOLAM HCL 2 MG/2ML IJ SOLN
INTRAMUSCULAR | Status: AC
  Filled 2020-07-21: qty 2

## 2020-07-21 MED ORDER — ONDANSETRON HCL 4 MG/2ML IJ SOLN
INTRAMUSCULAR | Status: AC
  Filled 2020-07-21: qty 2

## 2020-07-21 MED ORDER — ACETAMINOPHEN 500 MG PO TABS
1000.0000 mg | ORAL_TABLET | ORAL | Status: AC
  Administered 2020-07-21: 09:00:00 1000 mg via ORAL

## 2020-07-21 MED ORDER — DEXAMETHASONE SODIUM PHOSPHATE 10 MG/ML IJ SOLN
INTRAMUSCULAR | Status: AC
  Filled 2020-07-21: qty 1

## 2020-07-21 MED ORDER — DEXAMETHASONE SODIUM PHOSPHATE 10 MG/ML IJ SOLN
INTRAMUSCULAR | Status: DC | PRN
  Administered 2020-07-21: 10 mg via INTRAVENOUS

## 2020-07-21 MED ORDER — OXYCODONE HCL 5 MG/5ML PO SOLN: 5.0000 mg | Freq: Once | ORAL | Status: DC | PRN

## 2020-07-21 MED ORDER — PROPOFOL 10 MG/ML IV BOLUS
INTRAVENOUS | Status: AC
  Filled 2020-07-21: qty 20

## 2020-07-21 MED ORDER — LACTATED RINGERS IV SOLN: INTRAVENOUS | Status: DC

## 2020-07-21 MED ORDER — SCOPOLAMINE 1 MG/3DAYS TD PT72
MEDICATED_PATCH | TRANSDERMAL | Status: AC
  Filled 2020-07-21: qty 1

## 2020-07-21 MED ORDER — CEFAZOLIN SODIUM-DEXTROSE 2-4 GM/100ML-% IV SOLN
INTRAVENOUS | Status: AC
  Filled 2020-07-21: qty 100

## 2020-07-21 MED ORDER — ACETAMINOPHEN 500 MG PO TABS
ORAL_TABLET | ORAL | Status: AC
  Filled 2020-07-21: qty 2

## 2020-07-21 MED ORDER — PROMETHAZINE HCL 25 MG/ML IJ SOLN: 6.2500 mg | INTRAMUSCULAR | Status: DC | PRN

## 2020-07-21 MED ORDER — ONDANSETRON HCL 4 MG/2ML IJ SOLN
INTRAMUSCULAR | Status: DC | PRN
  Administered 2020-07-21: 4 mg via INTRAVENOUS

## 2020-07-21 MED ORDER — SCOPOLAMINE 1 MG/3DAYS TD PT72
1.0000 | MEDICATED_PATCH | TRANSDERMAL | Status: DC
  Administered 2020-07-21: 09:00:00 1.5 mg via TRANSDERMAL

## 2020-07-21 MED ORDER — FENTANYL CITRATE (PF) 100 MCG/2ML IJ SOLN
INTRAMUSCULAR | Status: AC
  Filled 2020-07-21: qty 2

## 2020-07-21 MED ORDER — MIDAZOLAM HCL 5 MG/5ML IJ SOLN
INTRAMUSCULAR | Status: DC | PRN
  Administered 2020-07-21: 2 mg via INTRAVENOUS

## 2020-07-21 MED ORDER — LIDOCAINE 2% (20 MG/ML) 5 ML SYRINGE
INTRAMUSCULAR | Status: AC
  Filled 2020-07-21: qty 5

## 2020-07-21 MED ORDER — AMISULPRIDE (ANTIEMETIC) 5 MG/2ML IV SOLN: 10.0000 mg | Freq: Once | INTRAVENOUS | Status: DC | PRN

## 2020-07-21 MED ORDER — LIDOCAINE HCL (CARDIAC) PF 100 MG/5ML IV SOSY
PREFILLED_SYRINGE | INTRAVENOUS | Status: DC | PRN
  Administered 2020-07-21: 80 mg via INTRAVENOUS

## 2020-07-21 MED ORDER — OXYCODONE HCL 5 MG PO TABS: 5.0000 mg | ORAL_TABLET | Freq: Once | ORAL | Status: DC | PRN

## 2020-07-21 MED ORDER — FENTANYL CITRATE (PF) 100 MCG/2ML IJ SOLN: 25.0000 ug | INTRAMUSCULAR | Status: DC | PRN

## 2020-07-21 MED ORDER — PROPOFOL 10 MG/ML IV BOLUS
INTRAVENOUS | Status: DC | PRN
  Administered 2020-07-21: 150 mg via INTRAVENOUS

## 2020-07-21 MED ORDER — BUPIVACAINE HCL 0.25 % IJ SOLN
INTRAMUSCULAR | Status: DC | PRN
  Administered 2020-07-21: 5 mL

## 2020-07-21 MED ORDER — FENTANYL CITRATE (PF) 100 MCG/2ML IJ SOLN
INTRAMUSCULAR | Status: DC | PRN
  Administered 2020-07-21 (×3): 25 ug via INTRAVENOUS

## 2020-07-21 MED ORDER — CEFAZOLIN SODIUM-DEXTROSE 2-4 GM/100ML-% IV SOLN
2.0000 g | INTRAVENOUS | Status: AC
  Administered 2020-07-21: 11:00:00 2 g via INTRAVENOUS

## 2020-07-21 SURGICAL SUPPLY — 52 items
APL PRP STRL LF DISP 70% ISPRP (MISCELLANEOUS) ×1
APL SKNCLS STERI-STRIP NONHPOA (GAUZE/BANDAGES/DRESSINGS) ×1
APPLIER CLIP 9.375 MED OPEN (MISCELLANEOUS) ×3
APR CLP MED 9.3 20 MLT OPN (MISCELLANEOUS) ×1
BENZOIN TINCTURE PRP APPL 2/3 (GAUZE/BANDAGES/DRESSINGS) ×3 IMPLANT
BLADE HEX COATED 2.75 (ELECTRODE) ×3 IMPLANT
BLADE SURG 15 STRL LF DISP TIS (BLADE) ×1 IMPLANT
BLADE SURG 15 STRL SS (BLADE) ×3
CANISTER SUC SOCK COL 7IN (MISCELLANEOUS) IMPLANT
CANISTER SUCT 1200ML W/VALVE (MISCELLANEOUS) IMPLANT
CHLORAPREP W/TINT 26 (MISCELLANEOUS) ×3 IMPLANT
CLIP APPLIE 9.375 MED OPEN (MISCELLANEOUS) ×1 IMPLANT
CLOSURE WOUND 1/2 X4 (GAUZE/BANDAGES/DRESSINGS) ×1
COVER BACK TABLE 60X90IN (DRAPES) ×3 IMPLANT
COVER MAYO STAND STRL (DRAPES) ×3 IMPLANT
COVER PROBE W GEL 5X96 (DRAPES) ×3 IMPLANT
DECANTER SPIKE VIAL GLASS SM (MISCELLANEOUS) IMPLANT
DRAPE LAPAROTOMY 100X72 PEDS (DRAPES) ×3 IMPLANT
DRAPE UTILITY XL STRL (DRAPES) ×3 IMPLANT
DRSG TEGADERM 4X4.75 (GAUZE/BANDAGES/DRESSINGS) ×3 IMPLANT
ELECT REM PT RETURN 9FT ADLT (ELECTROSURGICAL) ×3
ELECTRODE REM PT RTRN 9FT ADLT (ELECTROSURGICAL) ×1 IMPLANT
GAUZE SPONGE 4X4 12PLY STRL LF (GAUZE/BANDAGES/DRESSINGS) ×3 IMPLANT
GLOVE BIO SURGEON STRL SZ 6.5 (GLOVE) ×1 IMPLANT
GLOVE BIO SURGEON STRL SZ7 (GLOVE) ×5 IMPLANT
GLOVE BIO SURGEONS STRL SZ 6.5 (GLOVE) ×1
GLOVE BIOGEL PI IND STRL 7.5 (GLOVE) ×1 IMPLANT
GLOVE BIOGEL PI INDICATOR 7.5 (GLOVE) ×2
GOWN STRL REUS W/ TWL LRG LVL3 (GOWN DISPOSABLE) ×3 IMPLANT
GOWN STRL REUS W/TWL LRG LVL3 (GOWN DISPOSABLE) ×9
ILLUMINATOR WAVEGUIDE N/F (MISCELLANEOUS) IMPLANT
KIT MARKER MARGIN INK (KITS) ×3 IMPLANT
LIGHT WAVEGUIDE WIDE FLAT (MISCELLANEOUS) IMPLANT
NDL HYPO 25X1 1.5 SAFETY (NEEDLE) ×1 IMPLANT
NEEDLE HYPO 25X1 1.5 SAFETY (NEEDLE) ×3 IMPLANT
NS IRRIG 1000ML POUR BTL (IV SOLUTION) ×3 IMPLANT
PACK BASIN DAY SURGERY FS (CUSTOM PROCEDURE TRAY) ×3 IMPLANT
PENCIL SMOKE EVACUATOR (MISCELLANEOUS) ×3 IMPLANT
SLEEVE SCD COMPRESS KNEE MED (MISCELLANEOUS) ×3 IMPLANT
SPONGE LAP 4X18 RFD (DISPOSABLE) ×3 IMPLANT
STRIP CLOSURE SKIN 1/2X4 (GAUZE/BANDAGES/DRESSINGS) ×2 IMPLANT
SUT MON AB 4-0 PC3 18 (SUTURE) ×3 IMPLANT
SUT SILK 2 0 SH (SUTURE) IMPLANT
SUT VIC AB 3-0 SH 27 (SUTURE) ×3
SUT VIC AB 3-0 SH 27X BRD (SUTURE) ×1 IMPLANT
SYR BULB EAR ULCER 3OZ GRN STR (SYRINGE) IMPLANT
SYR CONTROL 10ML LL (SYRINGE) ×3 IMPLANT
TOWEL GREEN STERILE FF (TOWEL DISPOSABLE) ×3 IMPLANT
TRAY FAXITRON CT DISP (TRAY / TRAY PROCEDURE) ×3 IMPLANT
TUBE CONNECTING 20'X1/4 (TUBING)
TUBE CONNECTING 20X1/4 (TUBING) IMPLANT
YANKAUER SUCT BULB TIP NO VENT (SUCTIONS) IMPLANT

## 2020-07-21 NOTE — Transfer of Care (Signed)
Immediate Anesthesia Transfer of Care Note  Patient: Aurelio Jew  Procedure(s) Performed: LEFT BREAST LUMPECTOMY WITH RADIOACTIVE SEED LOCALIZATION (Left Breast)  Patient Location: PACU  Anesthesia Type:General  Level of Consciousness: drowsy  Airway & Oxygen Therapy: Patient Spontanous Breathing and Patient connected to face mask oxygen  Post-op Assessment: Report given to RN and Post -op Vital signs reviewed and stable  Post vital signs: Reviewed and stable  Last Vitals:  Vitals Value Taken Time  BP 133/76 07/21/20 1117  Temp    Pulse 54 07/21/20 1118  Resp 10 07/21/20 1118  SpO2 100 % 07/21/20 1118  Vitals shown include unvalidated device data.  Last Pain:  Vitals:   07/21/20 0858  TempSrc: Oral  PainSc: 0-No pain      Patients Stated Pain Goal: 1 (07/21/20 0858)  Complications: No complications documented.

## 2020-07-21 NOTE — Op Note (Addendum)
Pre-op Diagnosis:  Left complex sclerosing lesion Post-op Diagnosis: same Procedure:  Left radioactive seed localized lumpectomy Surgeon:  Feleica Fulmore K. Resident surgeon:  Dr. Sheria Lang I was personally present during the key and critical portions of this procedure and immediately available throughout the entire procedure, as documented in my operative note.  Anesthesia:  GEN - LMA Indications:  This is a 49 year old female who presents after a six-month close follow-up mammogram revealed an enlarging area of calcifications in the central left breast. She underwent biopsy of this area which revealed a complex sclerosing lesion with calcifications, ductal papilloma. The area measured 9x 3 x 5 mm. She presents now to discuss excision.   Description of procedure: The patient is brought to the operating room placed in supine position on the operating room table. After an adequate level of general anesthesia was obtained, her left breast was prepped with ChloraPrep and draped in sterile fashion. A timeout was taken to ensure the proper patient and proper procedure. We interrogated the breast with the neoprobe. We made a circumareolar incision around the upper side of the nipple after infiltrating with 0.25% Marcaine. Dissection was carried down in the breast tissue with cautery. We used the neoprobe to guide [place] towards the radioactive seed. We excised an area of tissue around the radioactive seed 2 cm in diameter. The specimen was removed and was oriented with a paint kit. Specimen mammogram showed the radioactive seed as well as the biopsy clip within the specimen. This was sent for pathologic examination. There is no residual radioactivity within the biopsy cavity. We inspected carefully for hemostasis. The wound was thoroughly irrigated. The wound was closed with a deep layer of 3-0 Vicryl and a subcuticular layer of 4-0 Monocryl. Benzoin Steri-Strips were applied. The patient was then extubated  and brought to the recovery room in stable condition. All sponge, instrument, and needle counts are correct.  Imogene Burn. Georgette Dover, MD, [facility] Surgery  General/ Trauma Surgery  07/21/2020 11:07 AM

## 2020-07-21 NOTE — Anesthesia Procedure Notes (Signed)
Procedure Name: LMA Insertion Date/Time: 07/21/2020 10:34 AM Performed by: Lauralyn Primes, CRNA Pre-anesthesia Checklist: Patient identified, Emergency Drugs available, Suction available and Patient being monitored Patient Re-evaluated:Patient Re-evaluated prior to induction Oxygen Delivery Method: Circle system utilized Preoxygenation: Pre-oxygenation with 100% oxygen Induction Type: IV induction Ventilation: Mask ventilation without difficulty LMA: LMA inserted LMA Size: 4.0 Number of attempts: 1 Airway Equipment and Method: Bite block Placement Confirmation: positive ETCO2 Tube secured with: Tape Dental Injury: Teeth and Oropharynx as per pre-operative assessment

## 2020-07-21 NOTE — H&P (Signed)
History of Present Illness  The patient is a 49 year old female who presents with a breast mass. Referred by Dr. Harold Hedge for left breast mass  This is a 49 year old female who presents after a six-month close follow-up mammogram revealed an enlarging area of calcifications in the central left breast. She underwent biopsy of this area which revealed a complex sclerosing lesion with calcifications, ductal papilloma. The area measured 9x 3 x 5 mm. She presents now to discuss excision.   No family history of breast cancer No previous breast problems  CLINICAL DATA: Follow-up probably benign left breast calcifications.  EXAM: DIGITAL DIAGNOSTIC UNILATERAL LEFT MAMMOGRAM WITH TOMO AND CAD  COMPARISON: Previous exam(s).  ACR Breast Density Category c: The breast tissue is heterogeneously dense, which may obscure small masses.  FINDINGS: There has been a mild increase in number of tiny punctate calcifications in a small group in the central left breast, slightly medially. The group measures 9 x 5 x 3 mm. No interval findings elsewhere in the breast suspicious for malignancy.  Mammographic images were processed with CAD.  IMPRESSION: 9 mm group of indeterminate calcifications in the central left breast, slightly medially, with a mild increase in number of calcifications.  RECOMMENDATION: Stereotactic guided core needle biopsy of the 9 mm group of calcifications in the central left breast, slightly medially. This has been discussed with the patient and scheduled at 9:30 a.m. on 05/21/2020.  I have discussed the findings and recommendations with the patient. If applicable, a reminder letter will be sent to the patient regarding the next appointment.  BI-RADS CATEGORY 4: Suspicious.   Electronically Signed By: Beckie Salts M.D. On: 05/11/2020 17:02    Problem List/Past Medical  MASS OF LEFT BREAST ON MAMMOGRAM (N63.20)   Past Surgical  History  Foot Surgery  Left. Tonsillectomy   Diagnostic Studies History Colonoscopy  never Mammogram  1-3 years ago Pap Smear  1-5 years ago  Allergies  Tetanus Toxoids  Allergies Reconciled   Medication History  Acyclovir (400MG  Tablet, Oral) Active. No Current Medications (Taken starting 06/08/2020) Gabapentin (100MG  Capsule, Oral) Active. prednisoLONE Acetate (1% Suspension, Ophthalmic) Active. Vitamin D (Ergocalciferol) (1.25 MG(50000 UT) Capsule, Oral) Active. Medications Reconciled  Social History  Alcohol use  Occasional alcohol use. Caffeine use  Carbonated beverages. No drug use  Tobacco use  Former smoker.  Family History  Colon Cancer  Family Members In General. Colon Polyps  Brother, Mother. Depression  Sister. Prostate Cancer  Father.  Pregnancy / Birth History Age at menarche  11 years. Contraceptive History  Oral contraceptives. Gravida  1 Irregular periods  Maternal age  64-20 Para  1  Other Problems  Back Pain  Gastroesophageal Reflux Disease   Vitals  Weight: 168.8 lb Height: 66in Body Surface Area: 1.86 m Body Mass Index: 27.24 kg/m  Temp.: 91F (Infrared)  Pulse: 91 (Regular)  BP: 116/62(Sitting, Left Arm, Standard)       Physical Exam  The physical exam findings are as follows: Note: Constitutional: WDWN in NAD, conversant, no obvious deformities; resting comfortably Eyes: Pupils equal, round; sclera anicteric; moist conjunctiva; no lid lag HENT: Oral mucosa moist; good dentition Neck: No masses palpated, trachea midline; no thyromegaly Breasts: symmetric; no nipple changes; bilateral fibrocystic changes; no dominant masses; no axillary lymphadenopathy Lungs: CTA bilaterally; normal respiratory effort CV: Regular rate and rhythm; no murmurs; extremities well-perfused with no edema Abd: +bowel sounds, soft, non-tender, no palpable organomegaly; no palpable hernias Musc:  Normal gait; no apparent  clubbing or cyanosis in extremities Lymphatic: No palpable cervical or axillary lymphadenopathy Skin: Warm, dry; no sign of jaundice Psychiatric - alert and oriented x 4; calm mood and affect    Assessment & Plan  MASS OF LEFT BREAST ON MAMMOGRAM (N63.20) Impression: Complex sclerosing lesion/ intraductal papilloma Current Plans Schedule for Surgery - Left radioactive seed localized lumpectomy. The surgical procedure has been discussed with the patient. Potential risks, benefits, alternative treatments, and expected outcomes have been explained. All of the patient's questions at this time have been answered. The likelihood of reaching the patient's treatment goal is good. The patient understand the proposed surgical procedure and wishes to proceed.   Wilmon Arms. Corliss Skains, MD, [facility]  General/ Trauma Surgery   07/21/2020 8:16 AM

## 2020-07-21 NOTE — Discharge Instructions (Signed)
[facility] Office Phone Number [phone]  BREAST BIOPSY/ PARTIAL MASTECTOMY: POST OP INSTRUCTIONS  Always review your discharge instruction sheet given to you by the facility where your surgery was performed.  IF YOU HAVE DISABILITY OR FAMILY LEAVE FORMS, YOU MUST BRING THEM TO THE OFFICE FOR PROCESSING.  DO NOT GIVE THEM TO YOUR DOCTOR.  1. A prescription for pain medication may be given to you upon discharge.  Take your pain medication as prescribed, if needed.  If narcotic pain medicine is not needed, then you may take acetaminophen (Tylenol) or ibuprofen (Advil) as needed. 2. Take your usually prescribed medications unless otherwise directed 3. If you need a refill on your pain medication, please contact your pharmacy.  They will contact our office to request authorization.  Prescriptions will not be filled after 5pm or on week-ends. 4. You should eat very light the first 24 hours after surgery, such as soup, crackers, pudding, etc.  Resume your normal diet the day after surgery. 5. Most patients will experience some swelling and bruising in the breast.  Ice packs and a good support bra will help.  Swelling and bruising can take several days to resolve.  6. It is common to experience some constipation if taking pain medication after surgery.  Increasing fluid intake and taking a stool softener will usually help or prevent this problem from occurring.  A mild laxative (Milk of Magnesia or Miralax) should be taken according to package directions if there are no bowel movements after 48 hours. 7. Unless discharge instructions indicate otherwise, you may remove your bandages 24-48 hours after surgery, and you may shower at that time.  You may have steri-strips (small skin tapes) in place directly over the incision.  These strips should be left on the skin for 7-10 days.  If your surgeon used skin glue on the incision, you may shower in 24 hours.  The glue will flake off over the  next 2-3 weeks.  Any sutures or staples will be removed at the office during your follow-up visit. 8. ACTIVITIES:  You may resume regular daily activities (gradually increasing) beginning the next day.  Wearing a good support bra or sports bra minimizes pain and swelling.  You may have sexual intercourse when it is comfortable. a. You may drive when you no longer are taking prescription pain medication, you can comfortably wear a seatbelt, and you can safely maneuver your car and apply brakes. b. RETURN TO WORK:  ______________________________________________________________________________________ 9. You should see your doctor in the office for a follow-up appointment approximately two weeks after your surgery.  Your doctor's nurse will typically make your follow-up appointment when she calls you with your pathology report.  Expect your pathology report 2-3 business days after your surgery.  You may call to check if you do not hear from [place] after three days. 10. OTHER INSTRUCTIONS: _______________________________________________________________________________________________ _____________________________________________________________________________________________________________________________________ _____________________________________________________________________________________________________________________________________ _____________________________________________________________________________________________________________________________________  WHEN TO CALL YOUR DOCTOR: 1. Fever over 101.0 2. Nausea and/or vomiting. 3. Extreme swelling or bruising. 4. Continued bleeding from incision. 5. Increased pain, redness, or drainage from the incision.  The clinic staff is available to answer your questions during regular business hours.  Please don't hesitate to call and ask to speak to one of the nurses for clinical concerns.  If you have a medical emergency, go to the nearest  emergency room or call 911.  A surgeon from [facility] is always on call at the hospital.  For further questions, please visit [email or web address]  No Tylenol until after 3pm today if needed  Post Anesthesia Home Care Instructions  Activity: Get plenty of rest for the remainder of the day. A responsible individual must stay with you for 24 hours following the procedure.  For the next 24 hours, DO NOT: -Drive a car -Advertising copywriter -Drink alcoholic beverages -Take any medication unless instructed by your physician -Make any legal decisions or sign important papers.  Meals: Start with liquid foods such as gelatin or soup. Progress to regular foods as tolerated. Avoid greasy, spicy, heavy foods. If nausea and/or vomiting occur, drink only clear liquids until the nausea and/or vomiting subsides. Call your physician if vomiting continues.  Special Instructions/Symptoms: Your throat may feel dry or sore from the anesthesia or the breathing tube placed in your throat during surgery. If this causes discomfort, gargle with warm salt water. The discomfort should disappear within 24 hours.  If you had a scopolamine patch placed behind your ear for the management of post- operative nausea and/or vomiting:  1. The medication in the patch is effective for 72 hours, after which it should be removed.  Wrap patch in a tissue and discard in the trash. Wash hands thoroughly with soap and water. 2. You may remove the patch earlier than 72 hours if you experience unpleasant side effects which may include dry mouth, dizziness or visual disturbances. 3. Avoid touching the patch. Wash your hands with soap and water after contact with the patch.

## 2020-07-21 NOTE — Anesthesia Postprocedure Evaluation (Signed)
Anesthesia Post Note  Patient: Wendy Cummings  Procedure(s) Performed: LEFT BREAST LUMPECTOMY WITH RADIOACTIVE SEED LOCALIZATION (Left Breast)     Patient location during evaluation: PACU Anesthesia Type: General Level of consciousness: awake and alert Pain management: pain level controlled Vital Signs Assessment: post-procedure vital signs reviewed and stable Respiratory status: spontaneous breathing, nonlabored ventilation and respiratory function stable Cardiovascular status: blood pressure returned to baseline and stable Postop Assessment: no apparent nausea or vomiting Anesthetic complications: no   No complications documented.  Last Vitals:  Vitals:   07/21/20 1145 07/21/20 1213  BP: 111/70 120/70  Pulse: 62 61  Resp: 15 16  Temp:  (!) 36.2 C  SpO2: 99% 100%    Last Pain:  Vitals:   07/21/20 1213  TempSrc:   PainSc: 3                  Candra R Tabius Rood

## 2020-07-21 NOTE — Anesthesia Preprocedure Evaluation (Addendum)
Anesthesia Evaluation  Patient identified by MRN, date of birth, ID band Patient awake    Reviewed: Allergy & Precautions, NPO status , Patient's Chart, lab work & pertinent test results  Airway Mallampati: II  TM Distance: >3 FB Neck ROM: Full    Dental no notable dental hx.    Pulmonary former smoker,  H/o COVID-19   Pulmonary exam normal breath sounds clear to auscultation       Cardiovascular Exercise Tolerance: Good negative cardio ROS Normal cardiovascular exam Rhythm:Regular Rate:Normal     Neuro/Psych negative neurological ROS  negative psych ROS   GI/Hepatic Neg liver ROS, GERD  ,  Endo/Other  negative endocrine ROS  Renal/GU negative Renal ROS  negative genitourinary   Musculoskeletal Coccyx pain   Abdominal   Peds negative pediatric ROS (+)  Hematology  (+) anemia ,   Anesthesia Other Findings   Reproductive/Obstetrics negative OB ROS                            Anesthesia Physical Anesthesia Plan  ASA: II  Anesthesia Plan: General   Post-op Pain Management:    Induction: Intravenous  PONV Risk Score and Plan: 3 and Midazolam, Dexamethasone, Ondansetron and Treatment may vary due to age or medical condition  Airway Management Planned: LMA  Additional Equipment:   Intra-op Plan:   Post-operative Plan: Extubation in OR  Informed Consent: I have reviewed the patients History and Physical, chart, labs and discussed the procedure including the risks, benefits and alternatives for the proposed anesthesia with the patient or authorized representative who has indicated his/her understanding and acceptance.     Dental advisory given  Plan Discussed with: CRNA, Anesthesiologist and Surgeon  Anesthesia Plan Comments:        Anesthesia Quick Evaluation

## 2020-07-22 ENCOUNTER — Encounter (HOSPITAL_BASED_OUTPATIENT_CLINIC_OR_DEPARTMENT_OTHER): Payer: Self-pay | Admitting: Surgery

## 2020-07-23 LAB — SURGICAL PATHOLOGY

## 2021-09-21 ENCOUNTER — Encounter (HOSPITAL_COMMUNITY): Payer: Self-pay

## 2021-12-08 IMAGING — MG MM DIGITAL DIAGNOSTIC UNILAT*L* W/ TOMO W/ CAD
6 series · 6 of 14 positions shown · non-contrast
Comparison: Previous exam(s).

CLINICAL DATA: Follow-up probably benign left breast
calcifications.

EXAM:
DIGITAL DIAGNOSTIC UNILATERAL LEFT MAMMOGRAM WITH TOMO AND CAD

[L CC]
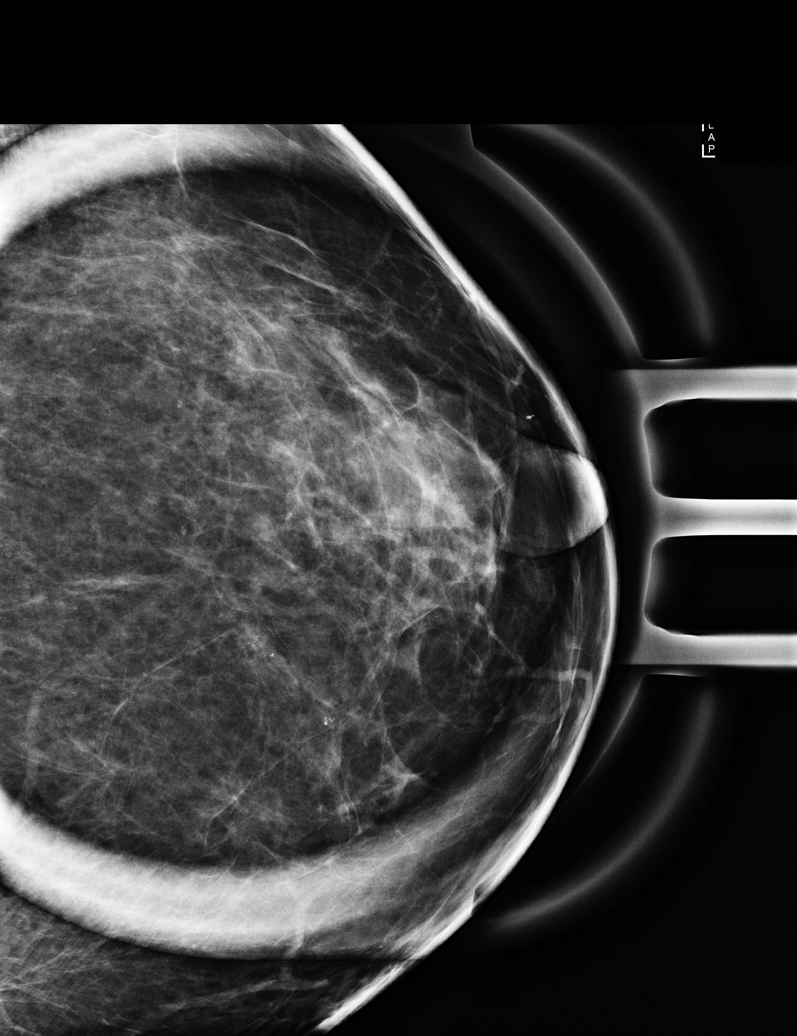

[L ML]
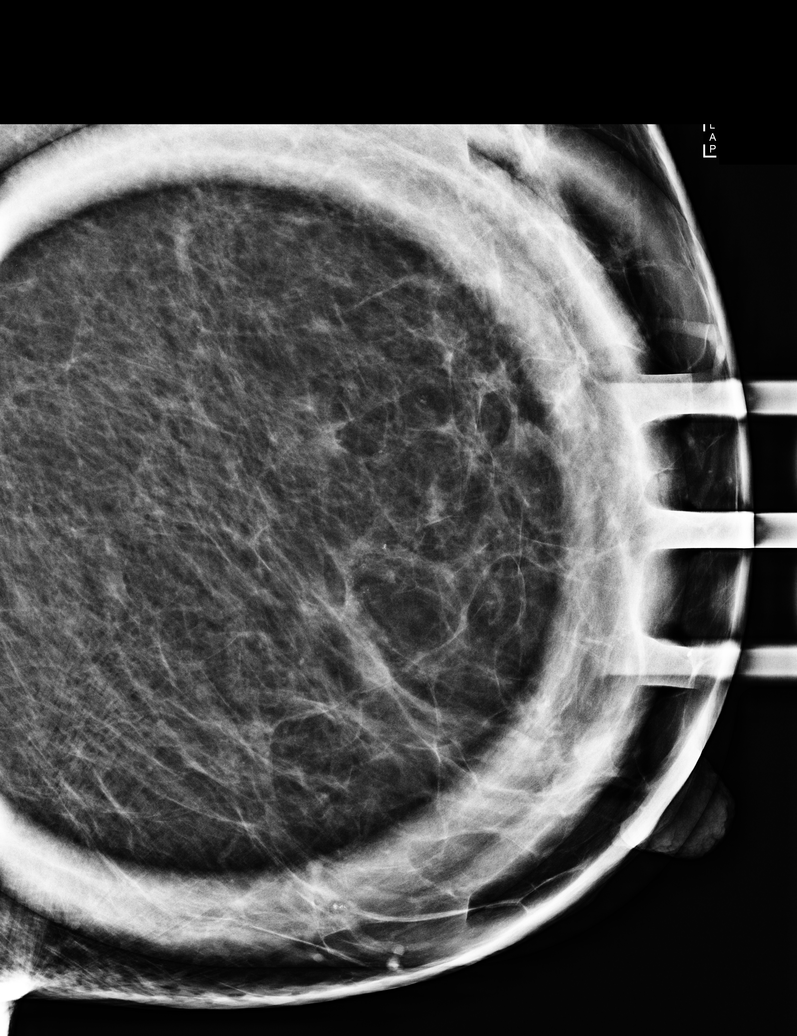

[L MLO synth-2D]
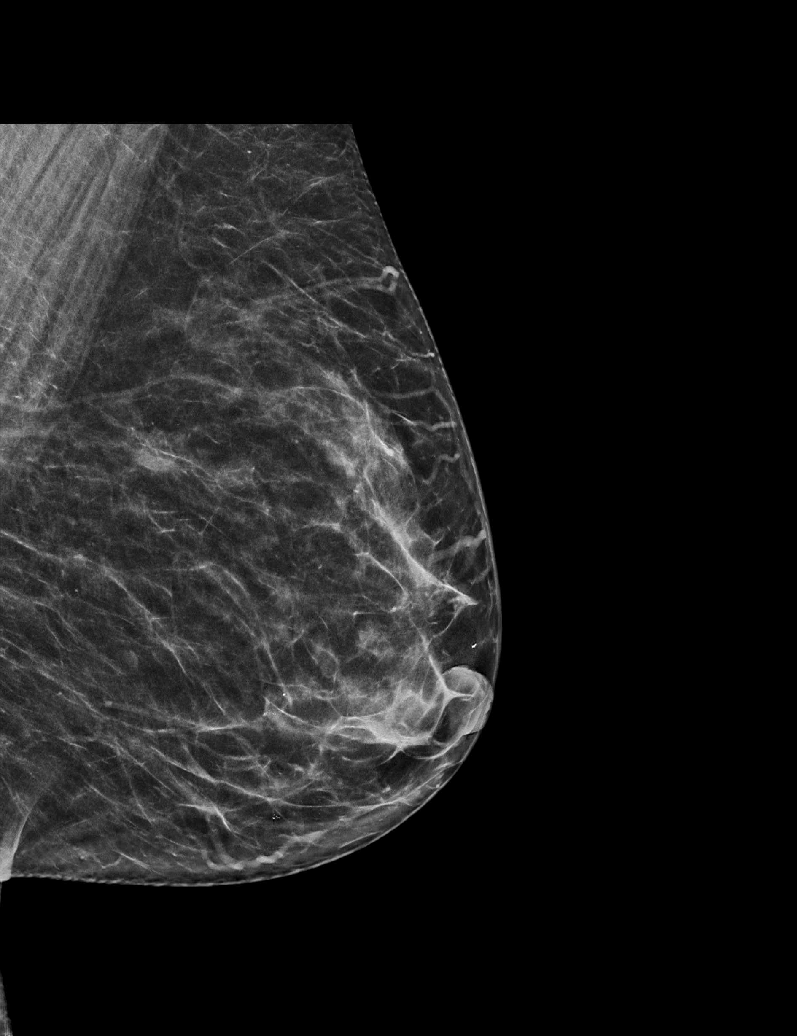

[L CC synth-2D]
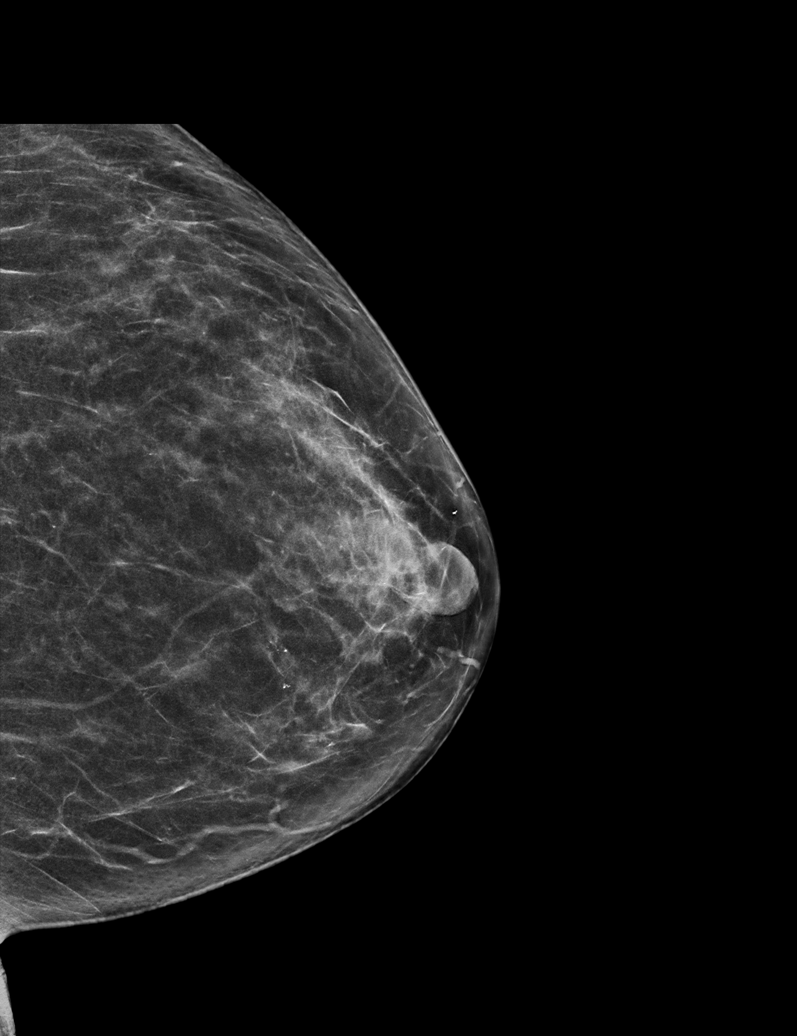

[L CC tomo · tomo slice 32/63.0]
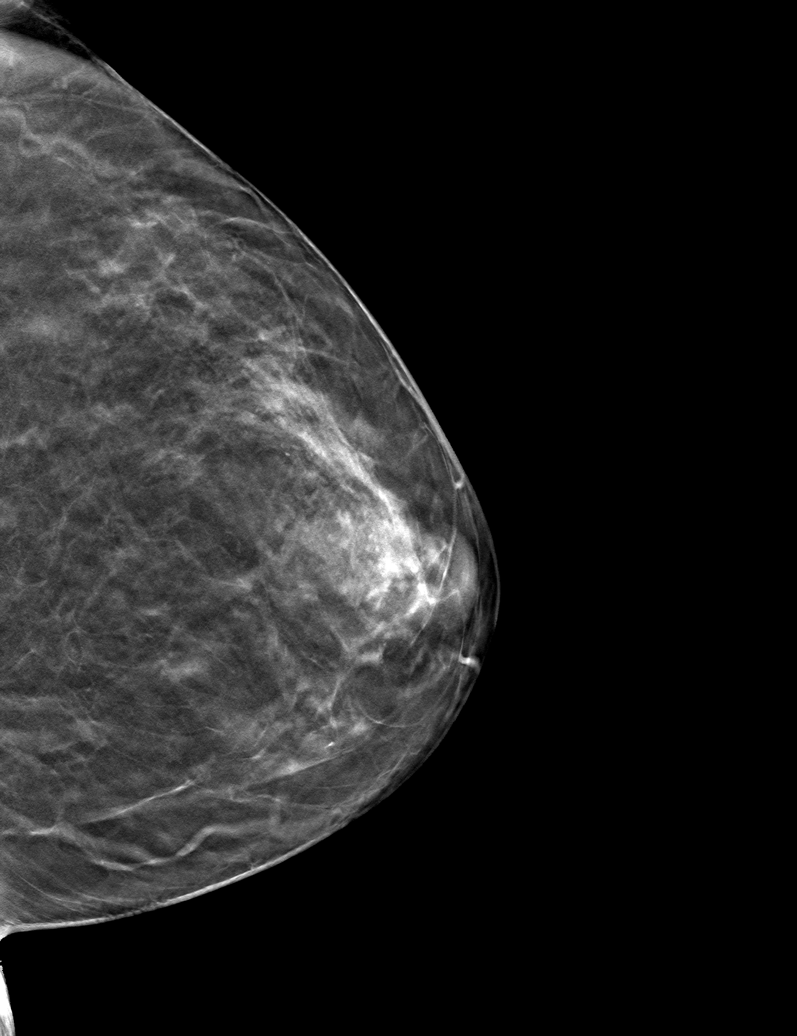

[L MLO tomo · tomo slice 30/59.0]
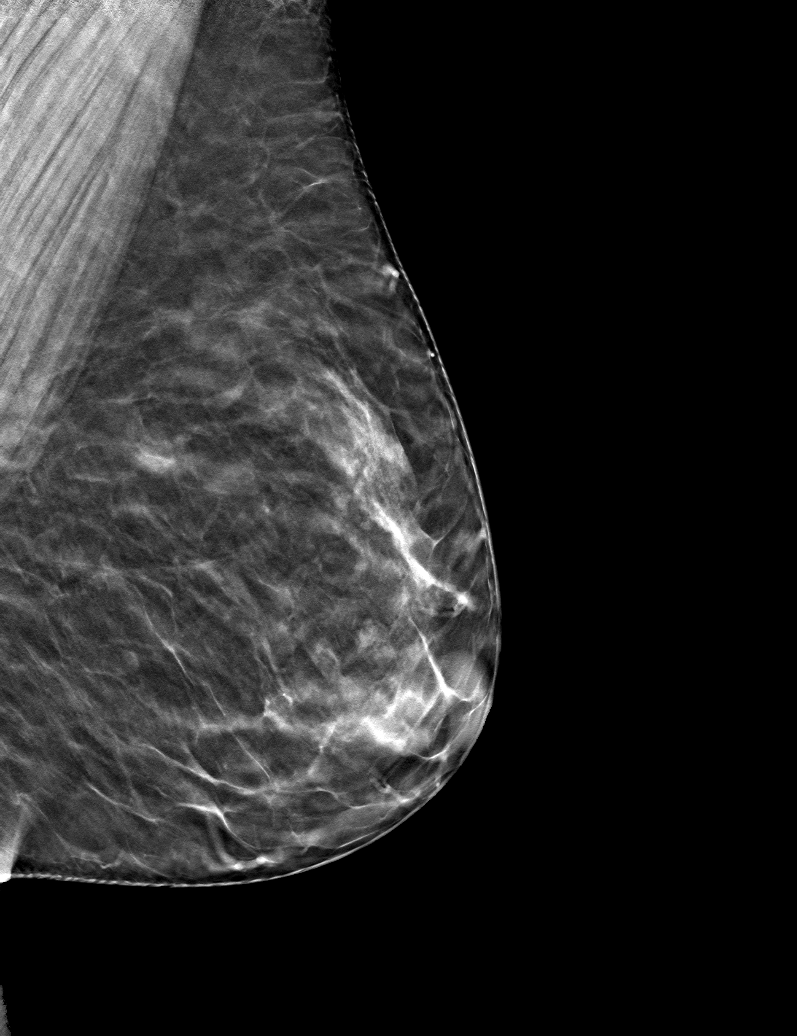

[6 of 14 positions shown; findings below may reference images not displayed]

ACR Breast Density Category c: The breast tissue is heterogeneously
dense, which may obscure small masses.
FINDINGS: There has been a mild increase in number of tiny punctate
calcifications in a small group in the central left breast, slightly
medially. The group measures 9 x 5 x 3 mm. No interval findings
elsewhere in the breast suspicious for malignancy.

Mammographic images were processed with CAD.
IMPRESSION: 9 mm group of indeterminate calcifications in the central left
breast, slightly medially, with a mild increase in number of
calcifications.

RECOMMENDATION:
Stereotactic guided core needle biopsy of the 9 mm group of
calcifications in the central left breast, slightly medially. This
has been discussed with the patient and scheduled at [DATE] a.m. on
05/21/2020.

I have discussed the findings and recommendations with the patient.
If applicable, a reminder letter will be sent to the patient
regarding the next appointment.

BI-RADS CATEGORY  4: Suspicious.

## 2021-12-18 IMAGING — MG MM BREAST LOCALIZATION CLIP
4 series · 4 of 12 positions shown · non-contrast
Comparison: Previous exams.

CLINICAL DATA: Post stereotactic guided biopsy of calcifications
within the central to slightly inner left breast.

EXAM:
DIAGNOSTIC LEFT MAMMOGRAM POST STEREOTACTIC BIOPSY

[L CC synth-2D]
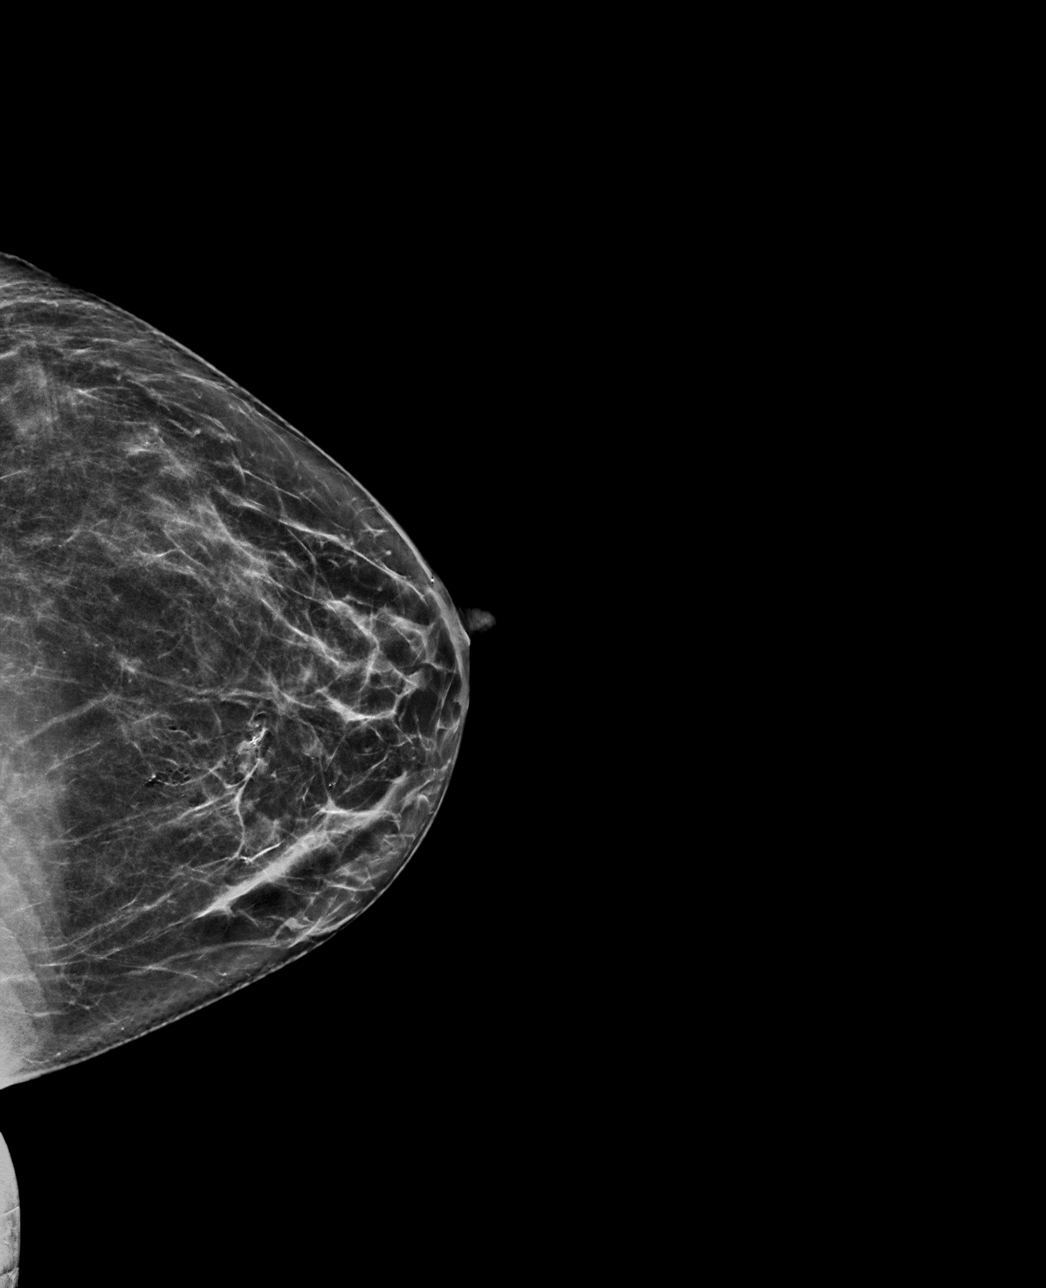

[L ML synth-2D]
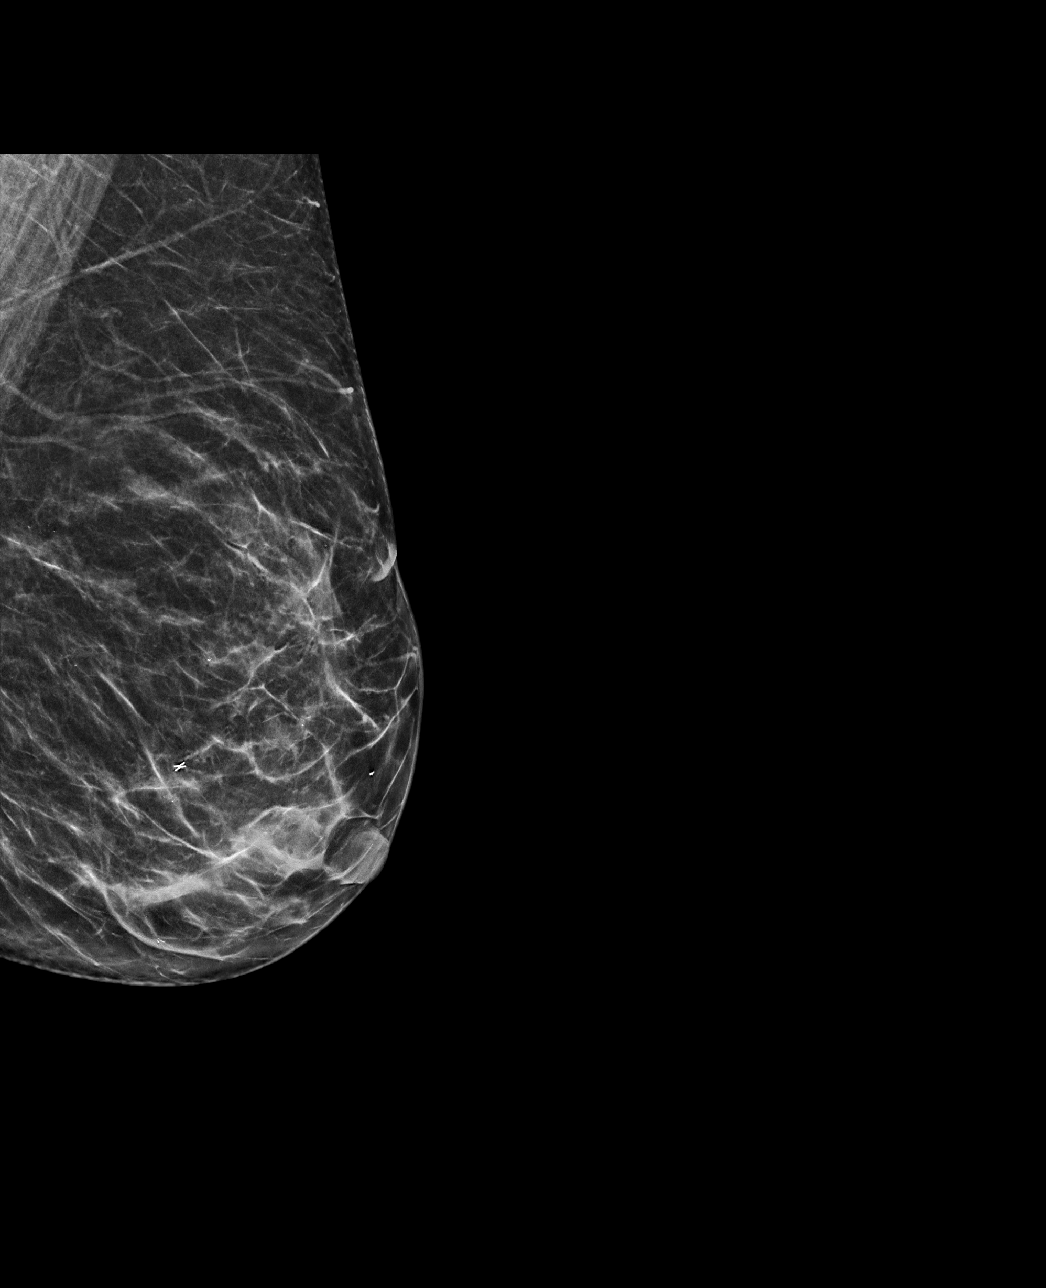

[L ML tomo · tomo slice 31/60.0]
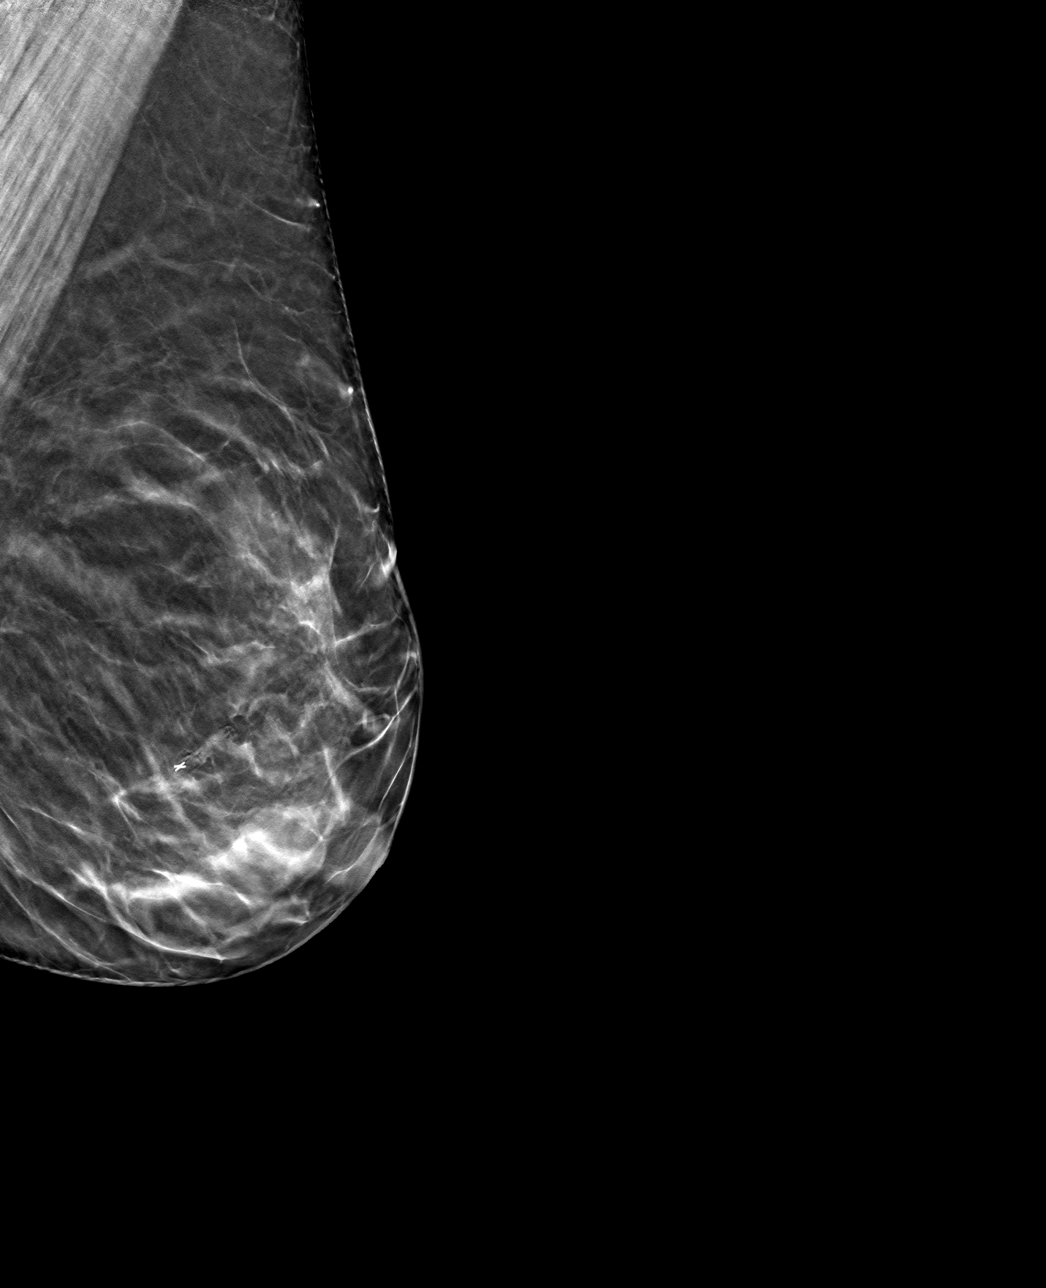

[L CC tomo · tomo slice 33/64.0]
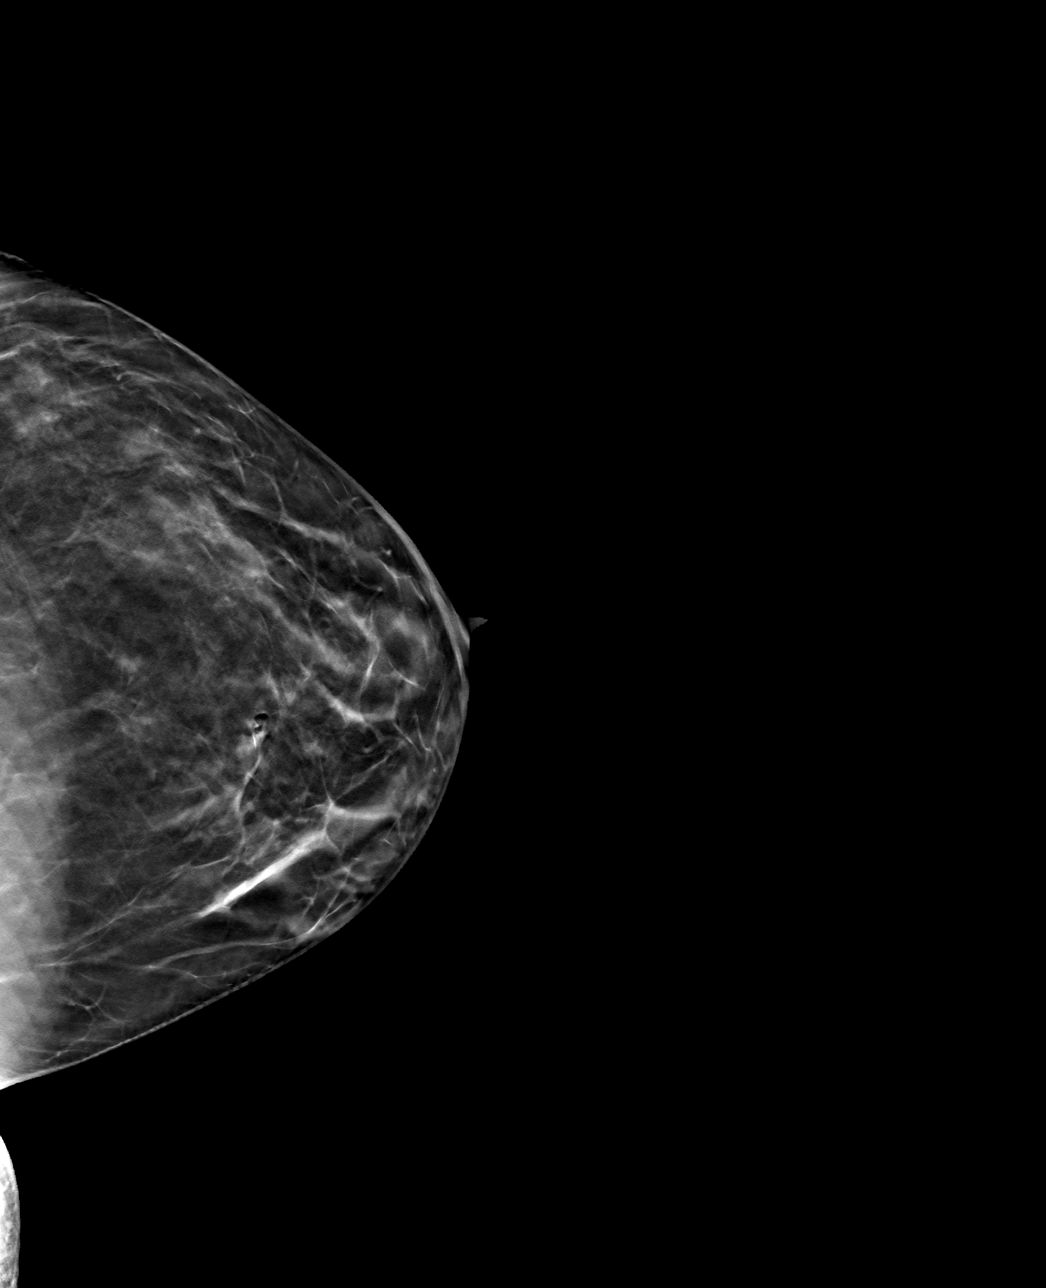

[4 of 12 positions shown; findings below may reference images not displayed]

FINDINGS: Mammographic images were obtained following stereotactic guided
biopsy of calcifications in the central to slightly inner left
breast. An X shaped biopsy marking clip is present at the superior
margin the biopsied calcifications in the slightly inner left
breast.
IMPRESSION: X shaped biopsy marking clip at superior margin of biopsied
calcifications in the inner left breast.

Final Assessment: Post Procedure Mammograms for Marker Placement

## 2022-02-07 ENCOUNTER — Other Ambulatory Visit: Payer: Self-pay

## 2022-03-02 NOTE — Progress Notes (Signed)
[name] [address] [place] [address] Phone: [phone] Subjective:   Wendy Cummings, am serving as a scribe for Dr. Antoine Primas.  I'm seeing this patient by the request  of:  Pcp, No  CC: Lower back and coccyx pain  VVO:[id number]  Wendy Cummings is a 51 y.o. female coming in with complaint of coccyx pain. Patient states that she took gabapentin and used for a couple of months after we saw her last year and her pain subsided. On Memorial Day weekend her pain returned. Patient went to [place] the end of June and her pain started going down the back of both legs to her foot. Noticed swelling in rectum following her trip in June but this has subsided. Was having painful bowel movements. Patient just got back from [place] and she did not have pain at all during this trip. Pain has gradually started to come back this week.       Past Medical History:  Diagnosis Date   Anemia    Arrhythmia    "fluttering feeling" does not see a cardiologist regularly   Coccyx pain    COVID-19 03/2019   no complications   GERD (gastroesophageal reflux disease)    History of shingles    left eye   Leg pain, bilateral    Leg swelling    Obesity    Past Surgical History:  Procedure Laterality Date   BARIATRIC SURGERY  01/2017   gastric sleeve    BREAST LUMPECTOMY WITH RADIOACTIVE SEED LOCALIZATION Left 07/21/2020   Procedure: LEFT BREAST LUMPECTOMY WITH RADIOACTIVE SEED LOCALIZATION;  Surgeon: Manus Rudd, MD;  Location: [facility];  Service: General;  Laterality: Left;  LMA   CHOLECYSTECTOMY     PLANTAR FASCIA SURGERY  2015   TONSILLECTOMY     Social History   Socioeconomic History   Marital status: Married    Spouse name: Not on file   Number of children: Not on file   Years of education: Not on file   Highest education level: Not on file  Occupational History   Not on file  Tobacco Use   Smoking status: Former     Packs/day: 0.50    Types: Cigarettes    Quit date: 01/31/2012    Years since quitting: 10.0   Smokeless tobacco: Never  Vaping Use   Vaping Use: Never used  Substance and Sexual Activity   Alcohol use: Yes    Comment: occasionally   Drug use: No   Sexual activity: Not on file  Other Topics Concern   Not on file  Social History Narrative   Not on file   Social Determinants of Health   Financial Resource Strain: Not on file  Food Insecurity: Not on file  Transportation Needs: Not on file  Physical Activity: Not on file  Stress: Not on file  Social Connections: Not on file   Allergies  Allergen Reactions   Tetanus Toxoid Swelling    REACTION: SWELLING   Family History  Problem Relation Age of Onset   Colon polyps Mother    Esophageal cancer Father    Prostate cancer Father    Colon cancer Maternal Grandfather    Rectal cancer Neg Hx    Stomach cancer Neg Hx          Current Outpatient Medications (Other):    acyclovir (ZOVIRAX) 400 MG tablet, Take 400 mg by mouth 5 (five) times daily.   gabapentin (  NEURONTIN) 100 MG capsule, Take 2 capsules (200 mg total) by mouth at bedtime.   Multiple Vitamin (MULTIVITAMIN) capsule, Take 1 capsule by mouth daily.   prednisoLONE acetate (PRED FORTE) 1 % ophthalmic suspension, Place 1 drop into the left eye as needed.   Vitamin D, Ergocalciferol, (DRISDOL) 1.25 MG (50000 UNIT) CAPS capsule, Take 1 capsule (50,000 Units total) by mouth every 7 (seven) days.   Vitamin D, Ergocalciferol, (DRISDOL) 1.25 MG (50000 UNIT) CAPS capsule, Take 1 capsule (50,000 Units total) by mouth every 7 (seven) days.   Reviewed prior external information including notes and imaging from  primary care provider As well as notes that were available from care everywhere and other healthcare systems.  Past medical history, social, surgical and family history all reviewed in electronic medical record.  No pertanent information unless stated regarding to  the chief complaint.   Review of Systems:  No headache, visual changes, nausea, vomiting, diarrhea,  dizziness, abdominal pain, skin rash, fevers, chills, night sweats, weight loss, swollen lymph nodes,, joint swelling, chest pain, shortness of breath, mood changes. POSITIVE muscle aches, body aches, mild constipation  Objective  Blood pressure 128/84, pulse (!) 59, height 5\' 6"  (1.676 m), weight 167 lb (75.8 kg), SpO2 96 %.   General: No apparent distress alert and oriented x3 mood and affect normal, dressed appropriately.  HEENT: Pupils equal, extraocular movements intact  Respiratory: Patient's speak in full sentences and does not appear short of breath  Cardiovascular: No lower extremity edema, non tender, no erythema  Low back exam does have some very mild loss of lordosis.  Tender to palpation right at the end of the sacrum and into the proximal aspect of the coccyx.  No masses appreciated.  No erythema of the skin.  Negative FABER test.  Patient does have some limited extension of 5 degrees of the back in 10 degrees of flexion.    Impression and Recommendations:    The above documentation has been reviewed and is accurate and complete , DO

## 2022-03-04 ENCOUNTER — Ambulatory Visit: Payer: Self-pay

## 2022-03-04 ENCOUNTER — Ambulatory Visit: Payer: 59 | Admitting: Family Medicine

## 2022-03-04 VITALS — BP 128/84 | HR 59 | Ht 66.0 in | Wt 167.0 lb

## 2022-03-04 DIAGNOSIS — M533 Sacrococcygeal disorders, not elsewhere classified: Secondary | ICD-10-CM | POA: Diagnosis not present

## 2022-03-04 MED ORDER — VITAMIN D (ERGOCALCIFEROL) 1.25 MG (50000 UNIT) PO CAPS: 50000.0000 [IU] | ORAL_CAPSULE | ORAL | 0 refills | Status: DC

## 2022-03-04 MED ORDER — GABAPENTIN 100 MG PO CAPS
200.0000 mg | ORAL_CAPSULE | Freq: Every day | ORAL | 0 refills | Status: DC
Start: 2022-03-04 — End: 2022-12-15

## 2022-03-04 NOTE — Patient Instructions (Addendum)
MRI lumbar pelvis [phone] Restart gabapentin and once weekly vit D We will be in touch See me in 6 weeks Keep GI appt

## 2022-03-04 NOTE — Assessment & Plan Note (Signed)
Patient is having recurrent symptoms again of this.  We will start vitamin D for the possibility of occult injury as well as the gabapentin.  Unfortunately pain seems to be worsening significantly.  Patient has had some bowel and bladder difficulty as well.  Mostly in the bowel.  Is following up with GI but I do think at this point with patient intermittently having this pain for 2 years that advanced imaging is warranted.  Patient has done formal physical therapy and medications with mild improvement from time to time but never completely gone.  MRI will help [place] decide if this is more of a lumbar radiculopathy or the possibility of any occult fracture that could be contributing.  Patient will follow-up after imaging

## 2022-03-08 ENCOUNTER — Ambulatory Visit: Payer: 59 | Admitting: Gastroenterology

## 2022-03-08 ENCOUNTER — Encounter: Payer: Self-pay | Admitting: Gastroenterology

## 2022-03-08 VITALS — BP 132/82 | HR 64 | Ht 66.0 in | Wt 170.0 lb

## 2022-03-08 DIAGNOSIS — M533 Sacrococcygeal disorders, not elsewhere classified: Secondary | ICD-10-CM

## 2022-03-08 DIAGNOSIS — R195 Other fecal abnormalities: Secondary | ICD-10-CM | POA: Diagnosis not present

## 2022-03-08 DIAGNOSIS — K648 Other hemorrhoids: Secondary | ICD-10-CM

## 2022-03-08 NOTE — Progress Notes (Signed)
Chief Complaint:    Diarrhea, coccydynia  GI History: 51 year old female with a history of SIPS/gastric sleeve and ccy in 2018, initially seen in the GI clinic on 09/13/2019 for evaluation of initial CRC screening and evaluation of diarrhea s/p ccy.   Diarrhea has been present since GI surgery above.  Now with chronic loose stools.  Has trialed cholestyramine- c/b constipation.  No abdominal pain.    Hx of shingles in left eye. Will have intemittent episodes of b/l scalp pain followed by diarrhea. Has since stopped all medications for this. Following with [facility] Ophthamologist/Allergy Eye specialist.   Family history notable for: -Father with Esophageal CA now s/p Chemo/XRT and esophagectomy -MGF with CRC -Mother with polyps   Endoscopic history: -EGD (10/2019, Dr. Barron Alvine): Normal esophagus, gastritis with gastric erosions (H. pylori positive), normal duodenum with normal biopsies, duodenal diverticulum -Colonoscopy (10/2019, Dr. Barron Alvine): Normal (biopsies negative for [facility]), normal TI.  Internal hemorrhoids.  Repeat in 5 years due to family history    HPI:     Patient is a 51 y.o. female presenting to the Gastroenterology Clinic for evaluation of diarrhea and pain in tailbone.  Was last seen in the GI clinic on 11/15/2019 for evaluation of similar symptoms.  In the interim, she has continued to have baseline loose stools, but not diarrhea. Had severe constipation with trial of cholestyramine  Recently traveled to [facility]. Subsequently with rectal type pain, rectal fullness, and feeling of anorectal blockage. No hematochezia. Was seen by Gyn who thought it was an anal fissure (vs hemorrhoids) and treated with hydrocortisone-pramoxine with improvement after 2 days (then stopped taking Rx).  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 Returned 10 days ago.  While on the trip, 7 people in her mission party were sick with n/v/d. She was prescribed 2 Abx and 1 probiotic, but only took for a  couple days. One of the other members was subsequently diagnosed with E. coli and Salmonella. Currently, she feels better, but still with loose stools and increased stool frequency. Can have up to 10 BM/day. Not frank diarrhea.  No hematochezia.  She brings a copy of labs from [place] dated 02/24/2022: - WBC 7.4, H/H 11.6/32.1, PLT 265, MCV/RDW 81/11.8  Recent labs from 11/18/2021: - Normal CMP (ALP 190), TSH, A1c  Separately, still having coccydynia.  Still has intermittent pain and can radiate down her leg.  She follows with Dr. Antoine Primas, last seen on 03/04/2022.  Has completed PT.  Was prescribed another course of gabapentin.  Awaiting approval of MRI.       Review of systems:     No chest pain, no SOB, no fevers, no urinary sx   Past Medical History:  Diagnosis Date   Anemia    Arrhythmia    "fluttering feeling" does not see a cardiologist regularly   Coccyx pain    COVID-19 03/2019   no complications   GERD (gastroesophageal reflux disease)    History of shingles    left eye   Leg pain, bilateral    Leg swelling    Obesity     Patient's surgical history, family medical history, social history, medications and allergies were all reviewed in Epic    Current Outpatient Medications  Medication Sig Dispense Refill   acyclovir (ZOVIRAX) 400 MG tablet Take 400 mg by mouth 5 (five) times daily.     gabapentin (NEURONTIN) 100 MG capsule Take 2 capsules (200 mg total) by mouth at bedtime. 180 capsule 0  Multiple Vitamin (MULTIVITAMIN) capsule Take 1 capsule by mouth daily.     prednisoLONE acetate (PRED FORTE) 1 % ophthalmic suspension Place 1 drop into the left eye as needed.     Vitamin D, Ergocalciferol, (DRISDOL) 1.25 MG (50000 UNIT) CAPS capsule Take 1 capsule (50,000 Units total) by mouth every 7 (seven) days. 12 capsule 0   Vitamin D, Ergocalciferol, (DRISDOL) 1.25 MG (50000 UNIT) CAPS capsule Take 1 capsule (50,000 Units total) by mouth every 7 (seven) days. 12 capsule  0   No current facility-administered medications for this visit.    Physical Exam:     There were no vitals taken for this visit.  GENERAL:  Pleasant female in NAD PSYCH: : Cooperative, normal affect CARDIAC:  RRR, no murmur heard, no peripheral edema PULM: Normal respiratory effort, lungs CTA bilaterally, no wheezing ABDOMEN:  Nondistended, soft, nontender. No obvious masses, no hepatomegaly,  normal bowel sounds NEURO: Alert and oriented x 3, no focal neurologic deficits Rectal exam: Sensation intact and preserved anal wink.  Small external skin tags.  No external anal fissures, external hemorrhoids. Normal sphincter tone. No palpable mass. No blood on the exam glove. +pain with digital manipulation of coccyx.  Anoscopy with small grade 1 hemorrhoids in all positions.  (Chaperone: Ailene Rud, CMA)   IMPRESSION and PLAN:    1) Change in bowel habits Has a longstanding history of chronic soft to loose stools suspected 2/2 prior CCY, but more recently with increased stool frequency following infectious gastroenteritis.  Suspect still has a component of lingering postinfectious irritability. - Start probiotic and continue for 4-6 weeks - Start fiber supplement such as Benefiber or Citrucel - Had significant constipation with previous trial of cholestyramine  2) Coccydynia - Continue follow-up with Dr. Ayesha Mohair - Awaiting approval of MRI to evaluate for radiculopathy, occult fracture, etc. - Interesting that she had improvement of coccydynia with recent short course of hydrocortisone-pramoxine.  Possibly some efficacy from the topical anesthetic  3) Hemorrhoids - Conservative management as these have since improved  I spent 35 minutes of time, including in depth chart review, independent review of results as outlined above, communicating results with the patient directly, face-to-face time with the patient, coordinating care, and ordering studies and medications as appropriate, and  documentation.       Shellia Cleverly ,DO, FACG 03/08/2022, 8:35 AM

## 2022-03-08 NOTE — Patient Instructions (Addendum)
If you are age 53 or older, your body mass index should be between 23-30. Your Body mass index is 27.44 kg/m. If this is out of the aforementioned range listed, please consider follow up with your Primary Care Provider.  If you are age 74 or younger, your body mass index should be between 19-25. Your Body mass index is 27.44 kg/m. If this is out of the aformentioned range listed, please consider follow up with your Primary Care Provider.   ________________________________________________________  The [facility] providers would like to encourage you to use [facility] to communicate with providers for non-urgent requests or questions.  Due to long hold times on the telephone, sending your provider a message by [facility] may be a faster and more efficient way to get a response.  Please allow 48 business hours for a response.  Please remember that this is for non-urgent requests.  _______________________________________________________  Due to recent changes in healthcare laws, you may see the results of your imaging and laboratory studies on MyChart before your provider has had a chance to review them.  We understand that in some cases there may be results that are confusing or concerning to you. Not all laboratory results come back in the same time frame and the provider may be waiting for multiple results in order to interpret others.  Please give [place] 48 hours in order for your provider to thoroughly review all the results before contacting the office for clarification of your results.   Start fiber supplement, use either Metamucil, or Benefiber.    start taking probiotics - Align.   Thank you for choosing me and [facility].  Vito Cirigliano, D.O.

## 2022-03-24 ENCOUNTER — Ambulatory Visit
Admission: RE | Admit: 2022-03-24 | Discharge: 2022-03-24 | Disposition: A | Discharge status: Home / Self Care | Payer: 59 | Source: Ambulatory Visit | Attending: Family Medicine | Admitting: Family Medicine

## 2022-03-24 DIAGNOSIS — M533 Sacrococcygeal disorders, not elsewhere classified: Secondary | ICD-10-CM

## 2022-03-29 ENCOUNTER — Telehealth: Payer: Self-pay | Admitting: Family Medicine

## 2022-03-29 NOTE — Telephone Encounter (Signed)
Pt read Dr. Michaelle Copas comments on both MRI's.  Was unable to response via MyChart but is interested in both the epidural AND the pelvic ultrasound. Wonders which we would recommend first.

## 2022-03-30 ENCOUNTER — Other Ambulatory Visit: Payer: Self-pay

## 2022-03-30 DIAGNOSIS — M5416 Radiculopathy, lumbar region: Secondary | ICD-10-CM

## 2022-03-30 DIAGNOSIS — N83209 Unspecified ovarian cyst, unspecified side: Secondary | ICD-10-CM

## 2022-03-30 NOTE — Telephone Encounter (Signed)
Orders placed. Patient notified.

## 2022-04-01 ENCOUNTER — Other Ambulatory Visit: Payer: Self-pay

## 2022-04-01 ENCOUNTER — Ambulatory Visit
Admission: RE | Admit: 2022-04-01 | Discharge: 2022-04-01 | Disposition: A | Discharge status: Home / Self Care | Payer: 59 | Source: Ambulatory Visit | Attending: Family Medicine | Admitting: Family Medicine

## 2022-04-01 DIAGNOSIS — N83209 Unspecified ovarian cyst, unspecified side: Secondary | ICD-10-CM

## 2022-04-02 ENCOUNTER — Encounter: Payer: Self-pay | Admitting: Family Medicine

## 2022-04-07 ENCOUNTER — Ambulatory Visit
Admission: RE | Admit: 2022-04-07 | Discharge: 2022-04-07 | Disposition: A | Discharge status: Home / Self Care | Payer: 59 | Source: Ambulatory Visit | Attending: Family Medicine | Admitting: Family Medicine

## 2022-04-07 DIAGNOSIS — M5416 Radiculopathy, lumbar region: Secondary | ICD-10-CM

## 2022-04-07 MED ORDER — IOPAMIDOL (ISOVUE-M 200) INJECTION 41%
1.0000 mL | Freq: Once | INTRAMUSCULAR | Status: AC
  Administered 2022-04-07: 1 mL via EPIDURAL

## 2022-04-07 MED ORDER — METHYLPREDNISOLONE ACETATE 40 MG/ML INJ SUSP (RADIOLOG
80.0000 mg | Freq: Once | INTRAMUSCULAR | Status: AC
  Administered 2022-04-07: 80 mg via EPIDURAL

## 2022-04-07 NOTE — Discharge Instructions (Signed)

## 2022-04-21 ENCOUNTER — Ambulatory Visit: Payer: 59 | Admitting: Family Medicine

## 2022-05-04 NOTE — Progress Notes (Unsigned)
[facility] [place] Phone: [phone] Subjective:   Fontaine No, am serving as a scribe for Dr. Hulan Saas.  I'm seeing this patient by the request  of:  Pcp, No  CC: back pain   [id number]  03/04/2022 Patient is having recurrent symptoms again of this.  We will start vitamin D for the possibility of occult injury as well as the gabapentin.  Unfortunately pain seems to be worsening significantly.  Patient has had some bowel and bladder difficulty as well.  Mostly in the bowel.  Is following up with GI but I do think at this point with patient intermittently having this pain for 2 years that advanced imaging is warranted.  Patient has done formal physical therapy and medications with mild improvement from time to time but never completely gone.  MRI will help [place] decide if this is more of a lumbar radiculopathy or the possibility of any occult fracture that could be contributing.  Patient will follow-up after imaging  Updated 05/05/2022 Akeira Lahm is a 51 y.o. female coming in with complaint of tailbone pain. Feels like epidural has improved her pain. Pain still present but much less than last visit. Patient hiked without pain recently.    Since last visit patient has had quite a number of imaging studies.  Was found to have an L4-L5 spinal stenosis noted with impingement affecting the L5 nerve root as well as facet arthropathy at L5-S1.  Patient also had a pelvic ultrasound showing the patient did have a likely hemorrhagic cyst of the right ovary and recommended pelvic ultrasound again in 2 to 3 months.  Patient did follow-up we can see on September 21 with her OB/GYN but cannot read the entire note.  Patient did undergo a L4-L5 epidural.  This was on April 07, 2022.    Past Medical History:  Diagnosis Date   Anemia    Arrhythmia    "fluttering feeling" does not see a cardiologist regularly   Coccyx pain     COVID-19 66/4403   no complications   GERD (gastroesophageal reflux disease)    History of shingles    left eye   Leg pain, bilateral    Leg swelling    Obesity    Past Surgical History:  Procedure Laterality Date   BARIATRIC SURGERY  01/2017   gastric sleeve    BREAST LUMPECTOMY WITH RADIOACTIVE SEED LOCALIZATION Left 07/21/2020   Procedure: LEFT BREAST LUMPECTOMY WITH RADIOACTIVE SEED LOCALIZATION;  Surgeon: Donnie Mesa, MD;  Location: [facility];  Service: General;  Laterality: Left;  LMA   CHOLECYSTECTOMY     PLANTAR FASCIA SURGERY  2015   TONSILLECTOMY     Social History   Socioeconomic History   Marital status: Married    Spouse name: Not on file   Number of children: Not on file   Years of education: Not on file   Highest education level: Not on file  Occupational History   Not on file  Tobacco Use   Smoking status: Former    Packs/day: 0.50    Types: Cigarettes    Quit date: 01/31/2012    Years since quitting: 10.2   Smokeless tobacco: Never  Vaping Use   Vaping Use: Never used  Substance and Sexual Activity   Alcohol use: Yes    Comment: occasionally   Drug use: No   Sexual activity: Not on file  Other Topics Concern  Not on file  Social History Narrative   Not on file   Social Determinants of Health   Financial Resource Strain: Not on file  Food Insecurity: Not on file  Transportation Needs: Not on file  Physical Activity: Not on file  Stress: Not on file  Social Connections: Not on file   Allergies  Allergen Reactions   Tetanus Toxoid Swelling    REACTION: SWELLING   Family History  Problem Relation Age of Onset   Colon polyps Mother    Esophageal cancer Father    Prostate cancer Father    Colon cancer Maternal Grandfather    Rectal cancer Neg Hx    Stomach cancer Neg Hx     Current Outpatient Medications (Endocrine & Metabolic):    predniSONE (DELTASONE) 20 MG tablet, Take 2 tablets (40 mg total) by mouth daily  with breakfast.      Current Outpatient Medications (Other):    AMBULATORY NON FORMULARY MEDICATION, 1 Application once a week. Medication Name: testosterone cream. Apply 1 peasize to inner thigh   gabapentin (NEURONTIN) 100 MG capsule, Take 2 capsules (200 mg total) by mouth at bedtime.   Multiple Vitamin (MULTIVITAMIN) capsule, Take 1 capsule by mouth daily.   prednisoLONE acetate (PRED FORTE) 1 % ophthalmic suspension, Place 1 drop into the left eye as needed.   Vitamin D, Ergocalciferol, (DRISDOL) 1.25 MG (50000 UNIT) CAPS capsule, Take 1 capsule (50,000 Units total) by mouth every 7 (seven) days.     Objective  Blood pressure 112/82, pulse 61, height 5\' 6"  (1.676 m), weight 168 lb (76.2 kg), SpO2 99 %.   General: No apparent distress alert and oriented x3 mood and affect normal, dressed appropriately.  HEENT: Pupils equal, extraocular movements intact  Respiratory: Patient's speak in full sentences and does not appear short of breath  Cardiovascular: No lower extremity edema, non tender, no erythema  Patient sitting comfortably without any significant discomfort on exam today.    Impression and Recommendations:    The above documentation has been reviewed and is accurate and complete , DO

## 2022-05-05 ENCOUNTER — Ambulatory Visit: Payer: 59 | Admitting: Family Medicine

## 2022-05-05 VITALS — BP 112/82 | HR 61 | Ht 66.0 in | Wt 168.0 lb

## 2022-05-05 DIAGNOSIS — M533 Sacrococcygeal disorders, not elsewhere classified: Secondary | ICD-10-CM | POA: Diagnosis not present

## 2022-05-05 DIAGNOSIS — M5416 Radiculopathy, lumbar region: Secondary | ICD-10-CM | POA: Diagnosis not present

## 2022-05-05 MED ORDER — PREDNISONE 20 MG PO TABS: 40.0000 mg | ORAL_TABLET | Freq: Every day | ORAL | 0 refills | Status: DC

## 2022-05-05 NOTE — Assessment & Plan Note (Signed)
Significant improvement at this time.  Patient responded extremely well to the epidural.  Discussed with patient that we can consider repeating it again if necessary.  Follow-up with me again as needed

## 2022-05-05 NOTE — Patient Instructions (Addendum)
[place] [phone] Call Today  When we receive your results we will contact you. Schedule near end of October  Prednisone 40mg  for 5 days in case for trip  So glad you're doing better

## 2022-05-26 ENCOUNTER — Ambulatory Visit
Admission: RE | Admit: 2022-05-26 | Discharge: 2022-05-26 | Disposition: A | Discharge status: Home / Self Care | Payer: 59 | Source: Ambulatory Visit | Attending: Family Medicine | Admitting: Family Medicine

## 2022-05-26 DIAGNOSIS — M5416 Radiculopathy, lumbar region: Secondary | ICD-10-CM

## 2022-05-26 MED ORDER — IOPAMIDOL (ISOVUE-M 200) INJECTION 41%
1.0000 mL | Freq: Once | INTRAMUSCULAR | Status: AC
  Administered 2022-05-26: 1 mL via EPIDURAL

## 2022-05-26 MED ORDER — METHYLPREDNISOLONE ACETATE 40 MG/ML INJ SUSP (RADIOLOG
80.0000 mg | Freq: Once | INTRAMUSCULAR | Status: AC
  Administered 2022-05-26: 80 mg via EPIDURAL

## 2022-05-26 NOTE — Discharge Instructions (Signed)

## 2022-07-07 NOTE — Progress Notes (Signed)
[name] [address] [place] [address] Phone: [phone] Subjective:    I'm seeing this patient by the request  of:  Pcp, No  CC: Low back pain  [id number]  05/05/2022 Significant improvement at this time.  Patient responded extremely well to the epidural.  Discussed with patient that we can consider repeating it again if necessary.  Follow-up with me again as needed     Update 07/08/2022 Wendy Cummings is a 51 y.o. female coming in with complaint of lumbar radiculopathy. Patient states continues to have some tightness but nothing severe at the moment.  Patient is taking the gabapentin at the moment.  Did have another epidural and states that that has been an improvement as well.  Patient is more concerned because having a cyst on the right ovary removed in the near future.     Past Medical History:  Diagnosis Date   Anemia    Arrhythmia    "fluttering feeling" does not see a cardiologist regularly   Coccyx pain    COVID-19 03/2019   no complications   GERD (gastroesophageal reflux disease)    History of shingles    left eye   Leg pain, bilateral    Leg swelling    Obesity    Past Surgical History:  Procedure Laterality Date   BARIATRIC SURGERY  01/2017   gastric sleeve    BREAST LUMPECTOMY WITH RADIOACTIVE SEED LOCALIZATION Left 07/21/2020   Procedure: LEFT BREAST LUMPECTOMY WITH RADIOACTIVE SEED LOCALIZATION;  Surgeon: Manus Rudd, MD;  Location: [facility];  Service: General;  Laterality: Left;  LMA   CHOLECYSTECTOMY     PLANTAR FASCIA SURGERY  2015   TONSILLECTOMY     Social History   Socioeconomic History   Marital status: Married    Spouse name: Not on file   Number of children: Not on file   Years of education: Not on file   Highest education level: Not on file  Occupational History   Not on file  Tobacco Use   Smoking status: Former    Packs/day: 0.50    Types: Cigarettes     Quit date: 01/31/2012    Years since quitting: 10.4   Smokeless tobacco: Never  Vaping Use   Vaping Use: Never used  Substance and Sexual Activity   Alcohol use: Yes    Comment: occasionally   Drug use: No   Sexual activity: Not on file  Other Topics Concern   Not on file  Social History Narrative   Not on file   Social Determinants of Health   Financial Resource Strain: Not on file  Food Insecurity: Not on file  Transportation Needs: Not on file  Physical Activity: Not on file  Stress: Not on file  Social Connections: Not on file   Allergies  Allergen Reactions   Tetanus Toxoid Swelling    SWELLING   Family History  Problem Relation Age of Onset   Colon polyps Mother    Esophageal cancer Father    Prostate cancer Father    Colon cancer Maternal Grandfather    Rectal cancer Neg Hx    Stomach cancer Neg Hx     Current Outpatient Medications (Endocrine & Metabolic):    predniSONE (DELTASONE) 20 MG tablet, Take 2 tablets (40 mg total) by mouth daily with breakfast.      Current Outpatient Medications (Other):    AMBULATORY NON FORMULARY MEDICATION, 1 Application once a week. Medication Name:  testosterone cream. Apply 1 peasize to inner thigh   gabapentin (NEURONTIN) 100 MG capsule, Take 2 capsules (200 mg total) by mouth at bedtime.   Multiple Vitamin (MULTIVITAMIN) capsule, Take 1 capsule by mouth daily.   prednisoLONE acetate (PRED FORTE) 1 % ophthalmic suspension, Place 1 drop into the left eye as needed.   Vitamin D, Ergocalciferol, (DRISDOL) 1.25 MG (50000 UNIT) CAPS capsule, Take 1 capsule (50,000 Units total) by mouth every 7 (seven) days.   Reviewed prior external information including notes and imaging from  primary care provider As well as notes that were available from care everywhere and other healthcare systems.  Past medical history, social, surgical and family history all reviewed in electronic medical record.  No pertanent information unless  stated regarding to the chief complaint.   Review of Systems:  No headache, visual changes, nausea, vomiting, diarrhea, constipation, dizziness, abdominal pain, skin rash, fevers, chills, night sweats, weight loss, swollen lymph nodes, body aches, joint swelling, chest pain, shortness of breath, mood changes. POSITIVE muscle aches  Objective  Weight 170 lb (77.1 kg).   General: No apparent distress alert and oriented x3 mood and affect normal, dressed appropriately.  HEENT: Pupils equal, extraocular movements intact  Respiratory: Patient's speak in full sentences and does not appear short of breath  Cardiovascular: No lower extremity edema, non tender, no erythema  Low back exam does have some mild loss of lordosis.  Some tenderness to palpation noted. Known spinal stenosis of the lumbar spine.  Osteopathic findings include  L4 flexed rotated and side bent right Sacrum right on right   Impression and Recommendations:     The above documentation has been reviewed and is accurate and complete Judi Saa, DO

## 2022-07-08 ENCOUNTER — Ambulatory Visit: Payer: 59 | Admitting: Family Medicine

## 2022-07-08 VITALS — Wt 170.0 lb

## 2022-07-08 DIAGNOSIS — M533 Sacrococcygeal disorders, not elsewhere classified: Secondary | ICD-10-CM

## 2022-07-08 DIAGNOSIS — M9903 Segmental and somatic dysfunction of lumbar region: Secondary | ICD-10-CM | POA: Diagnosis not present

## 2022-07-08 DIAGNOSIS — M9904 Segmental and somatic dysfunction of sacral region: Secondary | ICD-10-CM

## 2022-07-08 NOTE — Assessment & Plan Note (Signed)
Patient is doing relatively well after the last epidural.  Did have some tightness of the lower back and did attempt some mild osteopathic manipulation.  Patient is being treated for an abnormal ovarian cyst that will be removed next week. Patient can follow-up with me again in 6 to 8 weeks if the manipulation is helpful otherwise if doing very well can follow-up as needed

## 2022-07-08 NOTE — Assessment & Plan Note (Signed)
   Decision today to treat with OMT was based on Physical Exam  After verbal consent patient was treated with HVLA, ME, FPR techniques in lumbar and sacral areas, all areas are chronic   Patient tolerated the procedure well with improvement in symptoms  Patient given exercises, stretches and lifestyle modifications  See medications in patient instructions if given  Patient will follow up in 6-8 weeks 

## 2022-07-08 NOTE — Patient Instructions (Signed)
Great to see you Will do great with procedure See me in 7-8 weeks

## 2022-07-20 ENCOUNTER — Other Ambulatory Visit: Payer: Self-pay | Admitting: Obstetrics and Gynecology

## 2022-08-22 NOTE — Progress Notes (Signed)
[name] [place] Phone: [phone] Subjective:   Wendy Cummings, am serving as a scribe for Dr. Hulan Saas.  I'm seeing this patient by the request  of:  Pcp, No  CC: Back pain follow-up  [id number]  Wendy Cummings is a 52 y.o. female coming in with complaint of back and neck pain. OMT 07/08/2022. Patient states here for routine OMT. Patient has been having a bit more numbness running down her legs than normal. Patient has been noticing tingling in her feet and toes. States once she gets up and moving around it does go away.   Medications patient has been prescribed: None  Taking:         Reviewed prior external information including notes and imaging from previsou exam, outside providers and external EMR if available.   As well as notes that were available from care everywhere and other healthcare systems.  Past medical history, social, surgical and family history all reviewed in electronic medical record.  No pertanent information unless stated regarding to the chief complaint.   Past Medical History:  Diagnosis Date   Anemia    Arrhythmia    "fluttering feeling" does not see a cardiologist regularly   Coccyx pain    COVID-19 28/3662   no complications   GERD (gastroesophageal reflux disease)    History of shingles    left eye   Leg pain, bilateral    Leg swelling    Obesity     Allergies  Allergen Reactions   Tetanus Toxoid Swelling    SWELLING     Review of Systems:  No headache, visual changes, nausea, vomiting, diarrhea, constipation, dizziness, abdominal pain, skin rash, fevers, chills, night sweats, weight loss, swollen lymph nodes, body aches, joint swelling, chest pain, shortness of breath, mood changes. POSITIVE muscle aches  Objective  Blood pressure 130/82, pulse 60, height 5\' 6"  (1.676 m), SpO2 99 %.   General: No apparent distress alert and oriented x3 mood and  affect normal, dressed appropriately.  HEENT: Pupils equal, extraocular movements intact  Respiratory: Patient's speak in full sentences and does not appear short of breath  Cardiovascular: No lower extremity edema, non tender, no erythema  Neck exam does have some slurred speech.  Patient does have some tightness in the throat.  Neck exam also does have some tenderness noted.  Limited sidebending bilaterally. Abdominal exam minorly tender around the umbilical area where patient did have surgery greater than 4 weeks ago. Osteopathic findings  C2 flexed rotated and side bent right C5 flexed rotated and side bent left T5 extended rotated and side bent left  inhaled rib T8 extended rotated and side bent left L2 flexed rotated and side bent right Sacrum right on right       Assessment and Plan:  Nontraumatic coccydynia Patient does have more tightness than usual secondary to her being not quite as active secondary to her recent surgery.  Patient was able to respond well though to osteopathic manipulation today.  Patient will start with increasing her activity.  Discussed core strengthening exercises and isometrics, patient does have gabapentin 100 mg to take at night as needed  DISORDER, UTERUS NEC Recently did have the removal of the dermoid cyst noted.    Nonallopathic problems  Decision today to treat with OMT was based on Physical Exam  After verbal consent patient was treated with HVLA, ME, FPR techniques in cervical, rib, thoracic, lumbar, and sacral  areas  Patient tolerated the procedure well with improvement in symptoms  Patient given exercises, stretches and lifestyle modifications  See medications in patient instructions if given  Patient will follow up in 4-8 weeks      The above documentation has been reviewed and is accurate and complete [name], DO        Note: This dictation was prepared with Dragon dictation along with smaller phrase technology.  Any transcriptional errors that result from this process are unintentional.

## 2022-08-25 ENCOUNTER — Ambulatory Visit: Payer: Managed Care, Other (non HMO) | Admitting: Family Medicine

## 2022-08-25 ENCOUNTER — Encounter: Payer: Self-pay | Admitting: Family Medicine

## 2022-08-25 VITALS — BP 130/82 | HR 60 | Ht 66.0 in

## 2022-08-25 DIAGNOSIS — M9903 Segmental and somatic dysfunction of lumbar region: Secondary | ICD-10-CM | POA: Diagnosis not present

## 2022-08-25 DIAGNOSIS — M9901 Segmental and somatic dysfunction of cervical region: Secondary | ICD-10-CM | POA: Diagnosis not present

## 2022-08-25 DIAGNOSIS — M533 Sacrococcygeal disorders, not elsewhere classified: Secondary | ICD-10-CM

## 2022-08-25 DIAGNOSIS — M9908 Segmental and somatic dysfunction of rib cage: Secondary | ICD-10-CM

## 2022-08-25 DIAGNOSIS — M9902 Segmental and somatic dysfunction of thoracic region: Secondary | ICD-10-CM

## 2022-08-25 DIAGNOSIS — M9904 Segmental and somatic dysfunction of sacral region: Secondary | ICD-10-CM | POA: Diagnosis not present

## 2022-08-25 NOTE — Assessment & Plan Note (Signed)
Patient does have more tightness than usual secondary to her being not quite as active secondary to her recent surgery.  Patient was able to respond well though to osteopathic manipulation today.  Patient will start with increasing her activity.  Discussed core strengthening exercises and isometrics, patient does have gabapentin 100 mg to take at night as needed

## 2022-08-25 NOTE — Patient Instructions (Addendum)
Good to see you  Hope you get to go skiing Follow up in 2 months

## 2022-08-25 NOTE — Assessment & Plan Note (Signed)
Recently did have the removal of the dermoid cyst noted.

## 2022-10-26 NOTE — Progress Notes (Unsigned)
Wendy Cummings [address] [place] Phone: [phone] Subjective:   Wendy Cummings, am serving as a scribe for Dr. Hulan Saas.  I'm seeing this patient by the request  of:  Pcp, No  CC: Back and neck pain follow-up  [id number]  Wendy Cummings is a 52 y.o. female coming in with complaint of back and neck pain. OMT 08/25/2022. Patient states same per usual. Over the past week having a little numbness down left arm into 4th and 5th digit. No other concerns  Medications patient has been prescribed: None  Taking:         Reviewed prior external information including notes and imaging from previsou exam, outside providers and external EMR if available.   As well as notes that were available from care everywhere and other healthcare systems.  Past medical history, social, surgical and family history all reviewed in electronic medical record.  No pertanent information unless stated regarding to the chief complaint.   Past Medical History:  Diagnosis Date   Anemia    Arrhythmia    "fluttering feeling" does not see a cardiologist regularly   Coccyx pain    COVID-19 [id number]   no complications   GERD (gastroesophageal reflux disease)    History of shingles    left eye   Leg pain, bilateral    Leg swelling    Obesity     Allergies  Allergen Reactions   Tetanus Toxoid Swelling    SWELLING     Review of Systems:  No headache, visual changes, nausea, vomiting, diarrhea, constipation, dizziness, abdominal pain, skin rash, fevers, chills, night sweats, weight loss, swollen lymph nodes, body aches, joint swelling, chest pain, shortness of breath, mood changes. POSITIVE muscle aches  Objective  Blood pressure 116/82, pulse (!) 52, height 5\' 6"  (1.676 m), SpO2 98 %.   General: No apparent distress alert and oriented x3 mood and affect normal, dressed appropriately.  HEENT: Pupils equal, extraocular movements intact   Respiratory: Patient's speak in full sentences and does not appear short of breath  Cardiovascular: No lower extremity edema, non tender, no erythema  Back pain does have some loss lordosis still noted.  Seems to have tightness around the sacroiliac joint right greater than left.  Positive FABER test noted. Neck does have more tightness on the left than the right today.  Negative Spurling's.  Patient does have significant weakness in the C8 distribution on the left side.  2 out of 5 strength noted.  Osteopathic findings  C2 flexed rotated and side bent left C6 flexed rotated and side bent left T3 extended rotated and side bent left inhaled rib T9 extended rotated and side bent left L2 flexed rotated and side bent right Sacrum right on right       Assessment and Plan:  Nontraumatic coccydynia Still some tightness noted in the lower back.  Patient unfortunately also having some radicular symptoms of the left arm that we will continue to monitor.  Discussed with patient about icing regimen and home exercise.  Discussed which activities and which ones to avoid.  Prednisone and gabapentin given for the arm.  Follow-up with me again in 6 to 8 weeks.  Left hand weakness Patient has weakness in the C8 distribution.  Seems to be more likely cervical but negative margins noted today.  Negative Tinel's though at the cubital tunnel.  Discussed with patient to monitor what activities seem to make it worse.  Gabapentin and  prednisone given today.  Follow-up again in 6 to 8 weeks    Nonallopathic problems  Decision today to treat with OMT was based on Physical Exam  After verbal consent patient was treated with HVLA, ME, FPR techniques in cervical, rib, thoracic, lumbar, and sacral  areas  Patient tolerated the procedure well with improvement in symptoms  Patient given exercises, stretches and lifestyle modifications  See medications in patient instructions if given  Patient will follow up  in 4-8 weeks     The above documentation has been reviewed and is accurate and complete Wendy Pulley, DO         Note: This dictation was prepared with Dragon dictation along with smaller phrase technology. Any transcriptional errors that result from this process are unintentional.

## 2022-10-27 ENCOUNTER — Ambulatory Visit: Payer: Managed Care, Other (non HMO) | Admitting: Family Medicine

## 2022-10-27 VITALS — BP 116/82 | HR 52 | Ht 66.0 in

## 2022-10-27 DIAGNOSIS — M533 Sacrococcygeal disorders, not elsewhere classified: Secondary | ICD-10-CM

## 2022-10-27 DIAGNOSIS — R29898 Other symptoms and signs involving the musculoskeletal system: Secondary | ICD-10-CM | POA: Diagnosis not present

## 2022-10-27 MED ORDER — PREDNISONE 20 MG PO TABS: 40.0000 mg | ORAL_TABLET | Freq: Every day | ORAL | 0 refills | Status: DC

## 2022-10-27 NOTE — Assessment & Plan Note (Signed)
Patient has weakness in the C8 distribution.  Seems to be more likely cervical but negative margins noted today.  Negative Tinel's though at the cubital tunnel.  Discussed with patient to monitor what activities seem to make it worse.  Gabapentin and prednisone given today.  Follow-up again in 6 to 8 weeks

## 2022-10-27 NOTE — Patient Instructions (Signed)
Pred 40 for 5 days Continue gabapentin See me again in 7-8 weeks

## 2022-10-27 NOTE — Assessment & Plan Note (Signed)
Still some tightness noted in the lower back.  Patient unfortunately also having some radicular symptoms of the left arm that we will continue to monitor.  Discussed with patient about icing regimen and home exercise.  Discussed which activities and which ones to avoid.  Prednisone and gabapentin given for the arm.  Follow-up with me again in 6 to 8 weeks.

## 2022-12-14 NOTE — Progress Notes (Unsigned)
[name] [address] [place] [address] Phone: [phone] Subjective:   [name], am serving as a scribe for Dr. Antoine Primas.  I'm seeing this patient by the request  of:  Pcp, No  CC: Neck and back pain  [id number]  10/27/2022  Update 12/15/2022 Caleigh Bellefeuille is a 52 y.o. female coming in with complaint of neck and back pain. OMT last visit. Some tingling down the L arm. Prednisone helped these symptoms. Patient states that she has been doing a lot of travel. Pain in L leg increased due to being in planes and vehicles. Has not had this pain for a while. Pain has improved since she got back. Last epidural 05/2022. Patient notes pain in L hip with FABER whereas she is able to stretch more easily on R side.        Past Medical History:  Diagnosis Date   Anemia    Arrhythmia    "fluttering feeling" does not see a cardiologist regularly   Coccyx pain    COVID-19 03/2019   no complications   GERD (gastroesophageal reflux disease)    History of shingles    left eye   Leg pain, bilateral    Leg swelling    Obesity    Past Surgical History:  Procedure Laterality Date   BARIATRIC SURGERY  01/2017   gastric sleeve    BREAST LUMPECTOMY WITH RADIOACTIVE SEED LOCALIZATION Left 07/21/2020   Procedure: LEFT BREAST LUMPECTOMY WITH RADIOACTIVE SEED LOCALIZATION;  Surgeon: Manus Rudd, MD;  Location: [facility];  Service: General;  Laterality: Left;  LMA   CHOLECYSTECTOMY     PLANTAR FASCIA SURGERY  2015   TONSILLECTOMY     Social History   Socioeconomic History   Marital status: Married    Spouse name: Not on file   Number of children: Not on file   Years of education: Not on file   Highest education level: Not on file  Occupational History   Not on file  Tobacco Use   Smoking status: Former    Packs/day: .5    Types: Cigarettes    Quit date: 01/31/2012    Years since quitting: 10.8   Smokeless  tobacco: Never  Vaping Use   Vaping Use: Never used  Substance and Sexual Activity   Alcohol use: Yes    Comment: occasionally   Drug use: No   Sexual activity: Not on file  Other Topics Concern   Not on file  Social History Narrative   Not on file   Social Determinants of Health   Financial Resource Strain: Not on file  Food Insecurity: Not on file  Transportation Needs: Not on file  Physical Activity: Not on file  Stress: Not on file  Social Connections: Not on file   Allergies  Allergen Reactions   Tetanus Toxoid Swelling    SWELLING   Family History  Problem Relation Age of Onset   Colon polyps Mother    Esophageal cancer Father    Prostate cancer Father    Colon cancer Maternal Grandfather    Rectal cancer Neg Hx    Stomach cancer Neg Hx          Current Outpatient Medications (Other):    Multiple Vitamin (MULTIVITAMIN) capsule, Take 1 capsule by mouth daily.   prednisoLONE acetate (PRED FORTE) 1 % ophthalmic suspension, Place 1 drop into the left eye as needed.   Reviewed prior external information including  notes and imaging from  primary care provider As well as notes that were available from care everywhere and other healthcare systems.  Past medical history, social, surgical and family history all reviewed in electronic medical record.  No pertanent information unless stated regarding to the chief complaint.   Review of Systems:  No headache, visual changes, nausea, vomiting, diarrhea, constipation, dizziness, abdominal pain, skin rash, fevers, chills, night sweats, weight loss, swollen lymph nodes, body aches, joint swelling, chest pain, shortness of breath, mood changes. POSITIVE muscle aches  Objective  Blood pressure 120/84, pulse 65, height 5\' 6"  (1.676 m), weight 172 lb (78 kg), SpO2 98 %.   General: No apparent distress alert and oriented x3 mood and affect normal, dressed appropriately.  HEENT: Pupils equal, extraocular movements intact   Respiratory: Patient's speak in full sentences and does not appear short of breath  Cardiovascular: No lower extremity edema, non tender, no erythema  Continue tightness more on the paraspinal musculature of the lumbar spine right greater than left.  Tightness with FABER test noted still today. Low back exam does have some loss of lordosis noted.  Osteopathic findings  T8 extended rotated and side bent left L3 flexed rotated and side bent right Sacrum right on right    Impression and Recommendations:     The above documentation has been reviewed and is accurate and complete Judi Saa, DO

## 2022-12-15 ENCOUNTER — Ambulatory Visit (INDEPENDENT_AMBULATORY_CARE_PROVIDER_SITE_OTHER): Payer: Managed Care, Other (non HMO) | Admitting: Family Medicine

## 2022-12-15 VITALS — BP 120/84 | HR 65 | Ht 66.0 in | Wt 172.0 lb

## 2022-12-15 DIAGNOSIS — M5416 Radiculopathy, lumbar region: Secondary | ICD-10-CM

## 2022-12-15 DIAGNOSIS — M9902 Segmental and somatic dysfunction of thoracic region: Secondary | ICD-10-CM

## 2022-12-15 DIAGNOSIS — M9904 Segmental and somatic dysfunction of sacral region: Secondary | ICD-10-CM | POA: Diagnosis not present

## 2022-12-15 DIAGNOSIS — M9903 Segmental and somatic dysfunction of lumbar region: Secondary | ICD-10-CM

## 2022-12-15 DIAGNOSIS — M48061 Spinal stenosis, lumbar region without neurogenic claudication: Secondary | ICD-10-CM | POA: Insufficient documentation

## 2022-12-15 DIAGNOSIS — M48062 Spinal stenosis, lumbar region with neurogenic claudication: Secondary | ICD-10-CM

## 2022-12-15 NOTE — Assessment & Plan Note (Signed)

## 2022-12-15 NOTE — Assessment & Plan Note (Signed)
Worsening discomfort at this time.  Discussed which activities to do and which ones to avoid.  Increase activity slowly otherwise.  Follow-up with me again in 6 to 8 weeks.  Patient has had some increasing in discomfort so do feel another epidural could be beneficial.  Patient has responded to these previously.  Will have it ordered.  Patient will schedule at her convenience.  Still not bad enough that would need surgical intervention.

## 2022-12-15 NOTE — Patient Instructions (Addendum)
Good to see you!  [facility] [phone] Call Today  See you again in end of June

## 2023-01-05 ENCOUNTER — Ambulatory Visit
Admission: RE | Admit: 2023-01-05 | Discharge: 2023-01-05 | Disposition: A | Discharge status: Home / Self Care | Payer: Managed Care, Other (non HMO) | Source: Ambulatory Visit | Attending: Family Medicine | Admitting: Family Medicine

## 2023-01-05 DIAGNOSIS — M5416 Radiculopathy, lumbar region: Secondary | ICD-10-CM

## 2023-01-05 MED ORDER — IOPAMIDOL (ISOVUE-M 200) INJECTION 41%
1.0000 mL | Freq: Once | INTRAMUSCULAR | Status: AC
  Administered 2023-01-05: 1 mL via EPIDURAL

## 2023-01-05 MED ORDER — METHYLPREDNISOLONE ACETATE 40 MG/ML INJ SUSP (RADIOLOG
80.0000 mg | Freq: Once | INTRAMUSCULAR | Status: AC
  Administered 2023-01-05: 80 mg via EPIDURAL

## 2023-01-05 NOTE — Discharge Instructions (Signed)

## 2023-01-17 ENCOUNTER — Other Ambulatory Visit: Payer: Self-pay | Admitting: Obstetrics and Gynecology

## 2023-01-17 DIAGNOSIS — N63 Unspecified lump in unspecified breast: Secondary | ICD-10-CM

## 2023-01-26 ENCOUNTER — Ambulatory Visit
Admission: RE | Admit: 2023-01-26 | Discharge: 2023-01-26 | Disposition: A | Discharge status: Home / Self Care | Payer: Managed Care, Other (non HMO) | Source: Ambulatory Visit | Attending: Obstetrics and Gynecology | Admitting: Obstetrics and Gynecology

## 2023-01-26 DIAGNOSIS — N63 Unspecified lump in unspecified breast: Secondary | ICD-10-CM

## 2023-02-06 NOTE — Progress Notes (Unsigned)
[name] [address] [place] [address] Phone: [phone] Subjective:   [name], am serving as a scribe for Dr. Antoine Primas.  I'm seeing this patient by the request  of:  Pcp, No  CC: Back and neck pain follow-up  [id number]  Xitlally Vanderlugt is a 52 y.o. female coming in with complaint of back and neck pain. OMT 12/15/2022. Patient states that she has epidural on May 30th which was helpful. Continued intermittent numbness in her legs but feels a lot better.   Medications patient has been prescribed: None  Taking:         Reviewed prior external information including notes and imaging from previsou exam, outside providers and external EMR if available.   As well as notes that were available from care everywhere and other healthcare systems.  Past medical history, social, surgical and family history all reviewed in electronic medical record.  No pertanent information unless stated regarding to the chief complaint.   Past Medical History:  Diagnosis Date   Anemia    Arrhythmia    "fluttering feeling" does not see a cardiologist regularly   Coccyx pain    COVID-19 03/2019   no complications   GERD (gastroesophageal reflux disease)    History of shingles    left eye   Leg pain, bilateral    Leg swelling    Obesity     Allergies  Allergen Reactions   Tetanus Toxoid Swelling    SWELLING     Review of Systems:  No headache, visual changes, nausea, vomiting, diarrhea, constipation, dizziness, abdominal pain, skin rash, fevers, chills, night sweats, weight loss, swollen lymph nodes, body aches, joint swelling, chest pain, shortness of breath, mood changes. POSITIVE muscle aches  Objective  Blood pressure (!) 140/90, pulse 62, height 5\' 6"  (1.676 m), weight 177 lb (80.3 kg), SpO2 99 %.   General: No apparent distress alert and oriented x3 mood and affect normal, dressed appropriately.  HEENT: Pupils equal,  extraocular movements intact  Respiratory: Patient's speak in full sentences and does not appear short of breath  Cardiovascular: No lower extremity edema, non tender, no erythema  Patient does have some tightness noted in the parascapular area.  Significant tightness noted in the lower spine still.  Patient though does have improvement in flexion extension and does have improvement with FABER test.  Neurovascular intact distally.  Osteopathic findings  C2 flexed rotated and side bent right C6 flexed rotated and side bent left T3 extended rotated and side bent right inhaled rib T9 extended rotated and side bent left L2 flexed rotated and side bent right L3 flexed rotated and side bent left L4 flexed rotated and side bent right Sacrum right on right       Assessment and Plan:  Spinal stenosis of lumbar region Patient is making some improvement at this time.  Discussed which activities to do including home exercises and increasing slowly. Stay active.  RTC in 6 weeks respond well to OMT     Nonallopathic problems  Decision today to treat with OMT was based on Physical Exam  After verbal consent patient was treated with HVLA, ME, FPR techniques in cervical, rib, thoracic, lumbar, and sacral  areas  Patient tolerated the procedure well with improvement in symptoms  Patient given exercises, stretches and lifestyle modifications  See medications in patient instructions if given  Patient will follow up in 8 weeks      The above documentation  has been reviewed and is accurate and complete [name], DO        Note: This dictation was prepared with Dragon dictation along with smaller phrase technology. Any transcriptional errors that result from this process are unintentional.

## 2023-02-07 ENCOUNTER — Encounter: Payer: Self-pay | Admitting: Family Medicine

## 2023-02-07 ENCOUNTER — Ambulatory Visit: Payer: Managed Care, Other (non HMO) | Admitting: Family Medicine

## 2023-02-07 VITALS — BP 140/90 | HR 62 | Ht 66.0 in | Wt 177.0 lb

## 2023-02-07 DIAGNOSIS — M48062 Spinal stenosis, lumbar region with neurogenic claudication: Secondary | ICD-10-CM

## 2023-02-07 DIAGNOSIS — M9902 Segmental and somatic dysfunction of thoracic region: Secondary | ICD-10-CM

## 2023-02-07 DIAGNOSIS — M9903 Segmental and somatic dysfunction of lumbar region: Secondary | ICD-10-CM | POA: Diagnosis not present

## 2023-02-07 DIAGNOSIS — M9904 Segmental and somatic dysfunction of sacral region: Secondary | ICD-10-CM | POA: Diagnosis not present

## 2023-02-07 DIAGNOSIS — M9908 Segmental and somatic dysfunction of rib cage: Secondary | ICD-10-CM

## 2023-02-07 DIAGNOSIS — M9901 Segmental and somatic dysfunction of cervical region: Secondary | ICD-10-CM

## 2023-02-07 NOTE — Patient Instructions (Signed)
Good to see you Glad you are better See me in 6-8 weeks

## 2023-02-07 NOTE — Assessment & Plan Note (Signed)
Patient is making some improvement at this time.  Discussed which activities to do including home exercises and increasing slowly. Stay active.  RTC in 6 weeks respond well to OMT

## 2023-03-29 NOTE — Progress Notes (Unsigned)
[name] [address] [place] [address] Phone: [phone] Subjective:   Wendy Cummings, am serving as a scribe for Dr. Antoine Primas.  I'm seeing this patient by the request  of:  Pcp, No  CC: Back and neck pain follow-up  [id number]  Wendy Cummings is a 52 y.o. female coming in with complaint of back and neck pain. OMT 02/07/2023. Patient states that a couple of weeks ago she had pain down the side of L hip. Painful to perform FABER. Able to push through pain and was able to play pickleball. Pain has subsided in lateral hip but pain in L glute remains.   Medications patient has been prescribed: None  Taking:         Reviewed prior external information including notes and imaging from previsou exam, outside providers and external EMR if available.   As well as notes that were available from care everywhere and other healthcare systems.  Past medical history, social, surgical and family history all reviewed in electronic medical record.  No pertanent information unless stated regarding to the chief complaint.   Past Medical History:  Diagnosis Date   Anemia    Arrhythmia    "fluttering feeling" does not see a cardiologist regularly   Coccyx pain    COVID-19 03/2019   no complications   GERD (gastroesophageal reflux disease)    History of shingles    left eye   Leg pain, bilateral    Leg swelling    Obesity     Allergies  Allergen Reactions   Tetanus Toxoid Swelling    SWELLING     Review of Systems:  No headache, visual changes, nausea, vomiting, diarrhea, constipation, dizziness, abdominal pain, skin rash, fevers, chills, night sweats, weight loss, swollen lymph nodes, body aches, joint swelling, chest pain, shortness of breath, mood changes. POSITIVE muscle aches  Objective  Blood pressure 134/82, pulse 64, height 5\' 6"  (1.676 m), weight 174 lb (78.9 kg), SpO2 99%.   General: No apparent distress alert and  oriented x3 mood and affect normal, dressed appropriately.  HEENT: Pupils equal, extraocular movements intact  Respiratory: Patient's speak in full sentences and does not appear short of breath  Cardiovascular: No lower extremity edema, non tender, no erythema  Low back exam does have some loss of lordosis noted.  Some tenderness to palpation in the paraspinal musculature.  Patient's hip does have some tenderness to palpation over the greater trochanteric area.  More significant pain than what would be anticipated.  Positive Pearlean Brownie.   After verbal consent patient was prepped with alcohol swab and with a 21-gauge 2 inch needle injected into the left greater trochanteric area with 2 cc of 0.5% Marcaine and 1 cc of Kenalog 40 mg/mL.  No blood loss.  Band-Aid placed.  Postinjection instructions given  Osteopathic findings  C2 flexed rotated and side bent right C6 flexed rotated and side bent left T3 extended rotated and side bent right inhaled rib T9 extended rotated and side bent left L2 flexed rotated and side bent right L4 flexed rotated and side bent left Sacrum right on right       Assessment and Plan:  Spinal stenosis of lumbar region Known spinal stenosis at L4-L5.  Could be causing some weakness of the left side of the hip that could be potentially contributing to patient getting out of increasing discomfort and pain.  Discussed with patient about icing regimen and home exercises, discussed which activities  to do and which ones to avoid.  Discussed with patient about possible epidurals if needed.  Follow-up again in 6 to 8 weeks otherwise.  Greater trochanteric bursitis of left hip Patient given injection and tolerated the procedure well, discussed icing regimen and home exercises, discussed avoiding certain activities.  Increase activity slowly.  Follow-up again in 6 to 8 weeks    Nonallopathic problems  Decision today to treat with OMT was based on Physical Exam  After verbal  consent patient was treated with HVLA, ME, FPR techniques in cervical, rib, thoracic, lumbar, and sacral  areas  Patient tolerated the procedure well with improvement in symptoms  Patient given exercises, stretches and lifestyle modifications  See medications in patient instructions if given  Patient will follow up in 4-8 weeks     The above documentation has been reviewed and is accurate and complete Judi Saa, DO         Note: This dictation was prepared with Dragon dictation along with smaller phrase technology. Any transcriptional errors that result from this process are unintentional.

## 2023-03-30 ENCOUNTER — Ambulatory Visit: Payer: Managed Care, Other (non HMO) | Admitting: Family Medicine

## 2023-03-30 ENCOUNTER — Encounter: Payer: Self-pay | Admitting: Family Medicine

## 2023-03-30 VITALS — BP 134/82 | HR 64 | Ht 66.0 in | Wt 174.0 lb

## 2023-03-30 DIAGNOSIS — M9902 Segmental and somatic dysfunction of thoracic region: Secondary | ICD-10-CM

## 2023-03-30 DIAGNOSIS — M9903 Segmental and somatic dysfunction of lumbar region: Secondary | ICD-10-CM

## 2023-03-30 DIAGNOSIS — M7062 Trochanteric bursitis, left hip: Secondary | ICD-10-CM

## 2023-03-30 DIAGNOSIS — M9904 Segmental and somatic dysfunction of sacral region: Secondary | ICD-10-CM | POA: Diagnosis not present

## 2023-03-30 DIAGNOSIS — M9908 Segmental and somatic dysfunction of rib cage: Secondary | ICD-10-CM | POA: Diagnosis not present

## 2023-03-30 DIAGNOSIS — M9901 Segmental and somatic dysfunction of cervical region: Secondary | ICD-10-CM

## 2023-03-30 DIAGNOSIS — M48062 Spinal stenosis, lumbar region with neurogenic claudication: Secondary | ICD-10-CM | POA: Diagnosis not present

## 2023-03-30 NOTE — Assessment & Plan Note (Signed)
Known spinal stenosis at L4-L5.  Could be causing some weakness of the left side of the hip that could be potentially contributing to patient getting out of increasing discomfort and pain.  Discussed with patient about icing regimen and home exercises, discussed which activities to do and which ones to avoid.  Discussed with patient about possible epidurals if needed.  Follow-up again in 6 to 8 weeks otherwise.

## 2023-03-30 NOTE — Patient Instructions (Addendum)
Injected hip today Have fun on world travels  Ice area at night for 20 minutes See me in 8 weeks

## 2023-03-30 NOTE — Assessment & Plan Note (Signed)
Patient given injection and tolerated the procedure well, discussed icing regimen and home exercises, discussed avoiding certain activities.  Increase activity slowly.  Follow-up again in 6 to 8 weeks

## 2023-04-25 ENCOUNTER — Ambulatory Visit: Payer: Managed Care, Other (non HMO) | Admitting: Gastroenterology

## 2023-04-25 ENCOUNTER — Other Ambulatory Visit (INDEPENDENT_AMBULATORY_CARE_PROVIDER_SITE_OTHER): Payer: Managed Care, Other (non HMO)

## 2023-04-25 ENCOUNTER — Encounter: Payer: Self-pay | Admitting: Gastroenterology

## 2023-04-25 VITALS — BP 130/78 | HR 71 | Ht 66.0 in | Wt 173.8 lb

## 2023-04-25 DIAGNOSIS — R748 Abnormal levels of other serum enzymes: Secondary | ICD-10-CM

## 2023-04-25 DIAGNOSIS — R7401 Elevation of levels of liver transaminase levels: Secondary | ICD-10-CM

## 2023-04-25 DIAGNOSIS — K529 Noninfective gastroenteritis and colitis, unspecified: Secondary | ICD-10-CM | POA: Diagnosis not present

## 2023-04-25 LAB — GAMMA GT: GGT: 13 U/L (ref 7–51)

## 2023-04-25 LAB — HEPATIC FUNCTION PANEL
ALT: 35 U/L (ref 0–35)
AST: 36 U/L (ref 0–37)
Albumin: 3.9 g/dL (ref 3.5–5.2)
Alkaline Phosphatase: 144 U/L — ABNORMAL HIGH (ref 39–117)
Bilirubin, Direct: 0.1 mg/dL (ref 0.0–0.3)
Total Bilirubin: 0.5 mg/dL (ref 0.2–1.2)
Total Protein: 6.8 g/dL (ref 6.0–8.3)

## 2023-04-25 NOTE — Progress Notes (Signed)
Chief Complaint:    Elevated alkaline phosphatase  GI History: 52 year old female with a history of SIPS/gastric sleeve and ccy in 2018, initially seen in the GI clinic on 09/13/2019 for evaluation of initial CRC screening and evaluation of diarrhea s/p ccy.   Chronic diarrhea.  Diarrhea has been present since SIPS/gastric sleeve and CCY in 2018.  Described as chronic, loose, nonbloody stools. Has trialed cholestyramine- c/b constipation.  No abdominal pain.  Hx of shingles in left eye. Will have intemittent episodes of b/l scalp pain followed by diarrhea. Has since stopped all medications for this. Following with [facility] Ophthamologist/Allergy Eye specialist.    Family history notable for: -Father with Esophageal CA now s/p Chemo/XRT and esophagectomy -MGF with CRC -Mother with polyps   Endoscopic history: -EGD (10/2019, Dr. Barron Alvine): Normal esophagus, gastritis with gastric erosions (H. pylori positive), normal duodenum with normal biopsies, duodenal diverticulum; treated with quadruple therapy -Colonoscopy (10/2019, Dr. Barron Alvine): Normal (biopsies negative for [facility]), normal TI.  Internal hemorrhoids.  Repeat in 5 years due to family history  HPI:     Patient is a 52 y.o. female presenting to the Gastroenterology Clinic for follow-up.  Was last seen by me in the office on 03/08/2022 for follow-up of chronic diarrhea with acute exacerbation during mission trip to [place].  Was also having coccydynia and following with Dr. Katrinka Blazing at [facility], PT, and awaiting MRI.  MRI with L4/L5 spinal stenosis; underwent epidural.  Main issue today is evaluation of elevated ALP and ALT.  She had labs on 11/18/2021 notable for ALP 190, otherwise normal CMP, TSH, A1c.    She recently had follow-up with her GYN with repeat labs, again showing elevated ALP and mildly elevated ALT as below.  She brings a copy of those labs and with her today which I have reviewed and notable for the following:  - ALP: 143  --> 190 --> 187 --> 183 on 01/12/2023 - ALT: 29 --> 29 --> 42 --> 44 on 01/12/2023 - Normal T. bili, albumin, AST.   - 06/2022: Normal CBC, BMP  Had ovarian cyst removed after MRI L spine.  Has been following with Dr. Katrinka Blazing for bursitis, spinal stenosis.  No known family history of liver disease.  No personal history of jaundice, icteric sclera, ascites.  Separately, still has her chronic diarrhea which is largely unchanged.  Has been taking Pepto-Bismol when traveling which greatly improves symptoms.  Review of systems:     No chest pain, no SOB, no fevers, no urinary sx   Past Medical History:  Diagnosis Date   Anemia    Arrhythmia    "fluttering feeling" does not see a cardiologist regularly   Coccyx pain    COVID-19 03/2019   no complications   GERD (gastroesophageal reflux disease)    History of shingles    left eye   Leg pain, bilateral    Leg swelling    Obesity     Patient's surgical history, family medical history, social history, medications and allergies were all reviewed in Epic    Current Outpatient Medications  Medication Sig Dispense Refill   Multiple Vitamin (MULTIVITAMIN) capsule Take 1 capsule by mouth daily.     prednisoLONE acetate (PRED FORTE) 1 % ophthalmic suspension Place 1 drop into the left eye as needed.     No current facility-administered medications for this visit.    Physical Exam:     Ht 5\' 6"  (1.676 m)   Wt 173 lb 12.8 oz (78.8 kg)  BMI 28.05 kg/m   GENERAL:  Pleasant female in NAD PSYCH: : Cooperative, normal affect Musculoskeletal:  Normal muscle tone, normal strength NEURO: Alert and oriented x 3, no focal neurologic deficits   IMPRESSION and PLAN:    1) Elevated alkaline phosphatase Discussed DDx for elevated alkaline phosphatase, to include biliary and nonhepatic etiologies, particular given her known history of spinal stenosis and bursitis, along with prior gastric surgery.  Otherwise normal T. bili.  Plan for the  following: - Check alkaline phosphatase isoenzyme fractionization along with 5' nucleotidase and GGT - Repeat LFT panel - RUQ ultrasound - Depending on lab results, discussed possibility of MRI/MRCP and further extended serologic evaluation  2) Elevated ALT Mildly elevated ALT at 44 with otherwise normal AST. - Repeat LFT panel - RUQ ultrasound - Depending on results, again discussed role of extended serologic evaluation, fib 4 score, and/or ultrasound elastography  3) Chronic diarrhea Has had chronic diarrhea since SIPS/gastric sleeve and ccy in 2018.  Could not tolerate cholestyramine (constipation).  Colonoscopy with biopsies unremarkable in 2021.  Does well with Pepto-Bismol when traveling. - Ok to use Pepto or Imodium when traveling - May trial FODZYME - Fiber supplement          Shellia Cleverly ,DO, FACG 04/25/2023, 8:26 AM

## 2023-04-25 NOTE — Patient Instructions (Signed)
_______________________________________________________  If your blood pressure at your visit was 140/90 or greater, please contact your primary care physician to follow up on this.  _______________________________________________________  If you are age 54 or older, your body mass index should be between 23-30. Your Body mass index is 28.05 kg/m. If this is out of the aforementioned range listed, please consider follow up with your Primary Care Provider.  If you are age 80 or younger, your body mass index should be between 19-25. Your Body mass index is 28.05 kg/m. If this is out of the aformentioned range listed, please consider follow up with your Primary Care Provider.   ________________________________________________________  The [facility] providers would like to encourage you to use [facility] to communicate with providers for non-urgent requests or questions.  Due to long hold times on the telephone, sending your provider a message by [facility] may be a faster and more efficient way to get a response.  Please allow 48 business hours for a response.  Please remember that this is for non-urgent requests.  _______________________________________________________  Your provider has requested that you go to the basement level for lab work before leaving today. Press "B" on the elevator. The lab is located at the first door on the left as you exit the elevator.  You have been scheduled for an abdominal ultrasound at [facility] (1st floor of hospital) on 05-01-2023 at 830am. Please arrive 30 minutes prior to your appointment for registration. Make certain not to have anything to eat or drink midnight prior to your appointment. Should you need to reschedule your appointment, please contact radiology at [phone]. This test typically takes about 30 minutes to perform.  It was a pleasure to see you today!  Wendy Cummings, D.O.

## 2023-04-27 LAB — ALKALINE PHOSPHATASE, ISOENZYMES
Alkaline Phosphatase: 164 IU/L — ABNORMAL HIGH (ref 44–121)
BONE FRACTION: 44 % (ref 14–68)
INTESTINAL FRAC.: 0 % (ref 0–18)
LIVER FRACTION: 56 % (ref 18–85)

## 2023-04-28 LAB — NUCLEOTIDASE, 5', BLOOD: 5-Nucleotidase: 3 U/L (ref 0–10)

## 2023-05-01 ENCOUNTER — Ambulatory Visit (HOSPITAL_COMMUNITY)
Admission: RE | Admit: 2023-05-01 | Discharge: 2023-05-01 | Disposition: A | Discharge status: Home / Self Care | Payer: Managed Care, Other (non HMO) | Source: Ambulatory Visit | Attending: Gastroenterology | Admitting: Gastroenterology

## 2023-05-01 DIAGNOSIS — K529 Noninfective gastroenteritis and colitis, unspecified: Secondary | ICD-10-CM | POA: Insufficient documentation

## 2023-05-01 DIAGNOSIS — R748 Abnormal levels of other serum enzymes: Secondary | ICD-10-CM | POA: Insufficient documentation

## 2023-05-01 DIAGNOSIS — R7401 Elevation of levels of liver transaminase levels: Secondary | ICD-10-CM | POA: Insufficient documentation

## 2023-05-30 NOTE — Progress Notes (Unsigned)
[name] [address] [place] [address] Phone: [phone] Subjective:   Wendy Cummings, am serving as a scribe for Dr. Antoine Primas.  I'm seeing this patient by the request  of:  Pcp, No  CC: Back and neck pain follow-up  OZH:[id number]  Wendy Cummings is a 52 y.o. female coming in with complaint of back and neck pain.  Has had difficulty with coccydynia.  OMT 03/30/2023. Patient states that she has flares often time associated with traveling. Pain was serve and radiated down the L leg. Able to push through pain. Today her glute is sore. Hip overall is doing better though.   Medications patient has been prescribed: None  Taking:         Reviewed prior external information including notes and imaging from previsou exam, outside providers and external EMR if available.   As well as notes that were available from care everywhere and other healthcare systems.  Past medical history, social, surgical and family history all reviewed in electronic medical record.  No pertanent information unless stated regarding to the chief complaint.   Past Medical History:  Diagnosis Date   Anemia    Arrhythmia    "fluttering feeling" does not see a cardiologist regularly   Coccyx pain    COVID-19 03/2019   no complications   GERD (gastroesophageal reflux disease)    History of shingles    left eye   Leg pain, bilateral    Leg swelling    Obesity     Allergies  Allergen Reactions   Tetanus Toxoid Swelling    SWELLING     Review of Systems:  No headache, visual changes, nausea, vomiting, diarrhea, constipation, dizziness, abdominal pain, skin rash, fevers, chills, night sweats, weight loss, swollen lymph nodes, body aches, joint swelling, chest pain, shortness of breath, mood changes. POSITIVE muscle aches  Objective  Blood pressure (!) 126/92, pulse (!) 56, height 5\' 6"  (1.676 m), weight 175 lb (79.4 kg), SpO2 98%.   General: No  apparent distress alert and oriented x3 mood and affect normal, dressed appropriately.  HEENT: Pupils equal, extraocular movements intact  Respiratory: Patient's speak in full sentences and does not appear short of breath  Cardiovascular: No lower extremity edema, non tender, no erythema    Osteopathic findings  C2 flexed rotated and side bent right C7 flexed rotated and side bent left T6 extended rotated and side bent left inhaled rib T7 extended rotated and side bent left L2 flexed rotated and side bent right L3 flexed rotated and side bent left L5 flexed rotated and side bent left Sacrum right on right     Assessment and Plan:  Spinal stenosis of lumbar region Known spinal stenosis.  Does have anti-inflammatories.  Discussed icing regimen and home exercises, which activities to do which ones to avoid.  Patient did have a exacerbation.  Likely secondary to travel.  No need for prednisone at this time.  Discussed which activities to do.  Follow-up again in 2 months    Nonallopathic problems  Decision today to treat with OMT was based on Physical Exam  After verbal consent patient was treated with HVLA, ME, FPR techniques in cervical, rib, thoracic, lumbar, and sacral  areas  Patient tolerated the procedure well with improvement in symptoms  Patient given exercises, stretches and lifestyle modifications  See medications in patient instructions if given  Patient will follow up in 4-8 weeks    The above documentation has  been reviewed and is accurate and complete Judi Saa, DO          Note: This dictation was prepared with Dragon dictation along with smaller phrase technology. Any transcriptional errors that result from this process are unintentional.

## 2023-06-01 ENCOUNTER — Encounter: Payer: Self-pay | Admitting: Family Medicine

## 2023-06-01 ENCOUNTER — Ambulatory Visit: Payer: Managed Care, Other (non HMO) | Admitting: Family Medicine

## 2023-06-01 VITALS — BP 126/92 | HR 56 | Ht 66.0 in | Wt 175.0 lb

## 2023-06-01 DIAGNOSIS — M48062 Spinal stenosis, lumbar region with neurogenic claudication: Secondary | ICD-10-CM

## 2023-06-01 DIAGNOSIS — M9903 Segmental and somatic dysfunction of lumbar region: Secondary | ICD-10-CM | POA: Diagnosis not present

## 2023-06-01 DIAGNOSIS — M9904 Segmental and somatic dysfunction of sacral region: Secondary | ICD-10-CM | POA: Diagnosis not present

## 2023-06-01 DIAGNOSIS — M9902 Segmental and somatic dysfunction of thoracic region: Secondary | ICD-10-CM | POA: Diagnosis not present

## 2023-06-01 DIAGNOSIS — M9908 Segmental and somatic dysfunction of rib cage: Secondary | ICD-10-CM | POA: Diagnosis not present

## 2023-06-01 DIAGNOSIS — M9901 Segmental and somatic dysfunction of cervical region: Secondary | ICD-10-CM

## 2023-06-01 NOTE — Assessment & Plan Note (Signed)
Known spinal stenosis.  Does have anti-inflammatories.  Discussed icing regimen and home exercises, which activities to do which ones to avoid.  Patient did have a exacerbation.  Likely secondary to travel.  No need for prednisone at this time.  Discussed which activities to do.  Follow-up again in 2 months

## 2023-06-01 NOTE — Patient Instructions (Signed)
Good to see you [place] See me in 2 months

## 2023-07-26 NOTE — Progress Notes (Unsigned)
[name] [address] [place] [address] Phone: [phone] Subjective:   Wendy Cummings, am serving as a scribe for Dr. Antoine Primas.  I'm seeing this patient by the request  of:  Pcp, No  CC: Back and neck pain  OZH:[id number]  Wendy Cummings is a 52 y.o. female coming in with complaint of back and neck pain. OMT 06/01/2023. Patient states that her L leg has been numb down to the foot for past 2 days. Pain radiates into L hamstring. Has not been able to be as active recently due to an eye issue.   Medications patient has been prescribed: None  Taking:         Reviewed prior external information including notes and imaging from previsou exam, outside providers and external EMR if available.   As well as notes that were available from care everywhere and other healthcare systems.  Past medical history, social, surgical and family history all reviewed in electronic medical record.  No pertanent information unless stated regarding to the chief complaint.   Past Medical History:  Diagnosis Date   Anemia    Arrhythmia    "fluttering feeling" does not see a cardiologist regularly   Coccyx pain    COVID-19 03/2019   no complications   GERD (gastroesophageal reflux disease)    History of shingles    left eye   Leg pain, bilateral    Leg swelling    Obesity     Allergies  Allergen Reactions   Tetanus Toxoid Swelling    SWELLING     Review of Systems:  No headache, visual changes, nausea, vomiting, diarrhea, constipation, dizziness, abdominal pain, skin rash, fevers, chills, night sweats, weight loss, swollen lymph nodes, body aches, joint swelling, chest pain, shortness of breath, mood changes. POSITIVE muscle aches  Objective  Blood pressure 128/82, pulse 68, height 5\' 6"  (1.676 m), weight 177 lb (80.3 kg), SpO2 96%.   General: No apparent distress alert and oriented x3 mood and affect normal, dressed appropriately.   HEENT: Pupils equal, extraocular movements intact  Respiratory: Patient's speak in full sentences and does not appear short of breath  Cardiovascular: No lower extremity edema, non tender, no erythema  Gait MSK:  Back does have some loss lordosis noted.  Some tenderness to palpation diffusely on the right side of the paraspinal musculature in the thoracic and lumbar area.  Osteopathic findings  C2 flexed rotated and side bent right C6 flexed rotated and side bent left T3 extended rotated and side bent right inhaled rib T9 extended rotated and side bent right L2 flexed rotated and side bent right Sacrum right on right    Assessment and Plan:  Spinal stenosis of lumbar region Does have known spinal stenosis.  Discussed with patient about icing regimen and home exercises.  Increase activity slowly.  No other significant changes.  Has been on steroids for some time that could be potentially causing some other difficulty.  Discussed the possibility of different anti-inflammatories to consider doing the steroids at the moment.  Follow-up again in 6 to 8 weeks otherwise.    Nonallopathic problems  Decision today to treat with OMT was based on Physical Exam  After verbal consent patient was treated with HVLA, ME, FPR techniques in cervical, rib, thoracic, lumbar, and sacral  areas  Patient tolerated the procedure well with improvement in symptoms  Patient given exercises, stretches and lifestyle modifications  See medications in patient instructions if given  Patient will follow up in 4-8 weeks    The above documentation has been reviewed and is accurate and complete Judi Saa, DO          Note: This dictation was prepared with Dragon dictation along with smaller phrase technology. Any transcriptional errors that result from this process are unintentional.

## 2023-07-27 ENCOUNTER — Ambulatory Visit: Payer: Managed Care, Other (non HMO) | Admitting: Family Medicine

## 2023-07-27 ENCOUNTER — Encounter: Payer: Self-pay | Admitting: Family Medicine

## 2023-07-27 VITALS — BP 128/82 | HR 68 | Ht 66.0 in | Wt 177.0 lb

## 2023-07-27 DIAGNOSIS — M48062 Spinal stenosis, lumbar region with neurogenic claudication: Secondary | ICD-10-CM

## 2023-07-27 DIAGNOSIS — M9908 Segmental and somatic dysfunction of rib cage: Secondary | ICD-10-CM

## 2023-07-27 DIAGNOSIS — M9904 Segmental and somatic dysfunction of sacral region: Secondary | ICD-10-CM

## 2023-07-27 DIAGNOSIS — M9903 Segmental and somatic dysfunction of lumbar region: Secondary | ICD-10-CM | POA: Diagnosis not present

## 2023-07-27 DIAGNOSIS — M9902 Segmental and somatic dysfunction of thoracic region: Secondary | ICD-10-CM

## 2023-07-27 DIAGNOSIS — M9901 Segmental and somatic dysfunction of cervical region: Secondary | ICD-10-CM

## 2023-07-27 NOTE — Patient Instructions (Signed)
Good to see you See me before the mission trip And another visit for after the trip in March

## 2023-07-27 NOTE — Assessment & Plan Note (Signed)
Does have known spinal stenosis.  Discussed with patient about icing regimen and home exercises.  Increase activity slowly.  No other significant changes.  Has been on steroids for some time that could be potentially causing some other difficulty.  Discussed the possibility of different anti-inflammatories to consider doing the steroids at the moment.  Follow-up again in 6 to 8 weeks otherwise.

## 2023-10-03 ENCOUNTER — Encounter: Payer: Self-pay | Admitting: Emergency Medicine

## 2023-10-03 ENCOUNTER — Ambulatory Visit
Admission: EM | Admit: 2023-10-03 | Discharge: 2023-10-03 | Disposition: A | Discharge status: Home / Self Care | Payer: Managed Care, Other (non HMO) | Attending: Internal Medicine | Admitting: Internal Medicine

## 2023-10-03 DIAGNOSIS — R509 Fever, unspecified: Secondary | ICD-10-CM | POA: Diagnosis not present

## 2023-10-03 DIAGNOSIS — Z87891 Personal history of nicotine dependence: Secondary | ICD-10-CM | POA: Diagnosis not present

## 2023-10-03 DIAGNOSIS — R110 Nausea: Secondary | ICD-10-CM

## 2023-10-03 DIAGNOSIS — R059 Cough, unspecified: Secondary | ICD-10-CM | POA: Diagnosis not present

## 2023-10-03 DIAGNOSIS — Z1152 Encounter for screening for COVID-19: Secondary | ICD-10-CM | POA: Diagnosis not present

## 2023-10-03 DIAGNOSIS — J111 Influenza due to unidentified influenza virus with other respiratory manifestations: Secondary | ICD-10-CM

## 2023-10-03 LAB — POC COVID19/FLU A&B COMBO
Covid Antigen, POC: NEGATIVE
Influenza A Antigen, POC: NEGATIVE
Influenza B Antigen, POC: NEGATIVE

## 2023-10-03 MED ORDER — GUAIFENESIN ER 1200 MG PO TB12: 1200.0000 mg | ORAL_TABLET | Freq: Two times a day (BID) | ORAL | 0 refills | Status: DC

## 2023-10-03 MED ORDER — ACETAMINOPHEN 325 MG PO TABS
975.0000 mg | ORAL_TABLET | Freq: Once | ORAL | Status: AC
  Administered 2023-10-03: 975 mg via ORAL

## 2023-10-03 MED ORDER — ONDANSETRON 4 MG PO TBDP: 4.0000 mg | ORAL_TABLET | Freq: Three times a day (TID) | ORAL | 0 refills | Status: DC | PRN

## 2023-10-03 MED ORDER — OSELTAMIVIR PHOSPHATE 75 MG PO CAPS: 75.0000 mg | ORAL_CAPSULE | Freq: Two times a day (BID) | ORAL | 0 refills | Status: DC

## 2023-10-03 MED ORDER — PROMETHAZINE-DM 6.25-15 MG/5ML PO SYRP: 5.0000 mL | ORAL_SOLUTION | Freq: Every evening | ORAL | 0 refills | Status: DC | PRN

## 2023-10-03 NOTE — ED Triage Notes (Signed)
 Pt c/o fever, cough, chest pain when coughing, chills, and body aches since yesterday

## 2023-10-03 NOTE — Discharge Instructions (Signed)
 You have a viral illness which will improve on its own with rest, fluids, and medications to help with your symptoms. Your flu and COVID tests were negative in the clinic. I would like to go ahead and treat you for the flu given my high clinical suspicion that you have influenza.  Take Tamiflu every 12 hours for the next 5 days. If your COVID PCR test is positive, staff will call you and have you stop taking Tamiflu and start taking Paxlovid.  Tylenol, guaifenesin (plain mucinex), and saline nasal sprays may help relieve symptoms.  Promethazine DM at bedtime as needed for cough. Zofran every 8 hours as needed for nausea/vomiting.   Two teaspoons of honey in 1 cup of warm water every 4-6 hours may help with throat pains.  Humidifier in room at nighttime may help soothe cough (clean well daily).   For chest pain, shortness of breath, inability to keep food or fluids down without vomiting, fever that does not respond to tylenol or motrin, or any other severe symptoms, please go to the ER for further evaluation. Return to urgent care as needed, otherwise follow-up with PCP.

## 2023-10-03 NOTE — ED Provider Notes (Signed)
 [facility]    CSN: [id number] Arrival date & time: 10/03/23  7829      History   Chief Complaint Chief Complaint  Patient presents with   Fever    HPI Wendy Cummings is a 53 y.o. female.   Patient presents to urgent care for evaluation of cough, nasal congestion, generalized fatigue, body aches, and fever/chills that started yesterday.  Cough is minimally productive.  She is unsure of her max temp at home.  Husband was recently sick with similar symptoms.  Former cigarette smoker, denies history of chronic respiratory problems.  Reports chest tightness associated with cough as well as nausea without vomiting..  Otherwise, denies shortness of breath, dizziness, heart palpitations, diarrhea, abdominal pain, and rash.  Taking over-the-counter medications with some relief of symptoms PTA.   Fever   Past Medical History:  Diagnosis Date   Anemia    Arrhythmia    "fluttering feeling" does not see a cardiologist regularly   Coccyx pain    COVID-19 03/2019   no complications   GERD (gastroesophageal reflux disease)    History of shingles    left eye   Leg pain, bilateral    Leg swelling    Obesity     Patient Active Problem List   Diagnosis Date Noted   Greater trochanteric bursitis of left hip 03/30/2023   Spinal stenosis of lumbar region 12/15/2022   Left hand weakness 10/27/2022   Somatic dysfunction of spine, lumbar 07/08/2022   Nontraumatic coccydynia 04/07/2020   Pain in limb 03/05/2013   Leg swelling 03/05/2013   HYPERLIPIDEMIA, MIXED, MILD 06/22/2009   MITRAL REGURGITATION, 0 (MILD) 06/22/2009   PALPITATIONS 06/22/2009   DISORDER, UTERUS NEC 02/23/2007   PAP SMEAR, ABNORMAL 02/23/2007    Past Surgical History:  Procedure Laterality Date   BARIATRIC SURGERY  01/2017   gastric sleeve    BREAST LUMPECTOMY WITH RADIOACTIVE SEED LOCALIZATION Left 07/21/2020   Procedure: LEFT BREAST LUMPECTOMY WITH RADIOACTIVE SEED LOCALIZATION;  Surgeon:  Manus Rudd, MD;  Location: [facility];  Service: General;  Laterality: Left;  LMA   CHOLECYSTECTOMY     PLANTAR FASCIA SURGERY  2015   TONSILLECTOMY      OB History   No obstetric history on file.      Home Medications    Prior to Admission medications   Medication Sig Start Date End Date Taking? Authorizing Provider  Guaifenesin 1200 MG TB12 Take 1 tablet (1,200 mg total) by mouth in the morning and at bedtime. 10/03/23  Yes Carlisle Beers, FNP  ondansetron (ZOFRAN-ODT) 4 MG disintegrating tablet Take 1 tablet (4 mg total) by mouth every 8 (eight) hours as needed for nausea or vomiting. 10/03/23  Yes Carlisle Beers, FNP  oseltamivir (TAMIFLU) 75 MG capsule Take 1 capsule (75 mg total) by mouth every 12 (twelve) hours. 10/03/23  Yes Carlisle Beers, FNP  prednisoLONE acetate (PRED FORTE) 1 % ophthalmic suspension 1 drop. 08/20/23  Yes [provider]  promethazine-dextromethorphan (PROMETHAZINE-DM) 6.25-15 MG/5ML syrup Take 5 mLs by mouth at bedtime as needed for cough. 10/03/23  Yes Carlisle Beers, FNP  valACYclovir (VALTREX) 500 MG tablet Take 1 tablet by mouth 2 (two) times daily. 09/14/23 09/13/24 Yes [provider]  Multiple Vitamin (MULTIVITAMIN) capsule Take 1 capsule by mouth daily.    [provider]    Family History Family History  Problem Relation Age of Onset   Colon polyps Mother    Esophageal cancer  Father    Prostate cancer Father    Colon cancer Maternal Grandfather    Rectal cancer Neg Hx    Stomach cancer Neg Hx     Social History Social History   Tobacco Use   Smoking status: Former    Current packs/day: 0.00    Types: Cigarettes    Quit date: 01/31/2012    Years since quitting: 11.6   Smokeless tobacco: Never  Vaping Use   Vaping status: Never Used  Substance Use Topics   Alcohol use: Yes    Comment: occasionally   Drug use: No     Allergies   Tetanus toxoid   Review of  Systems Review of Systems  Constitutional:  Positive for fever.  Per HPI   Physical Exam Triage Vital Signs ED Triage Vitals  Encounter Vitals Group     BP 10/03/23 0838 (!) 140/85     Systolic BP Percentile --      Diastolic BP Percentile --      Pulse Rate 10/03/23 0838 95     Resp 10/03/23 0838 17     Temp 10/03/23 0838 (!) 100.6 F (38.1 C)     Temp Source 10/03/23 0838 Oral     SpO2 10/03/23 0838 94 %     Weight --      Height --      Head Circumference --      Peak Flow --      Pain Score 10/03/23 0841 6     Pain Loc --      Pain Education --      Exclude from Growth Chart --    No data found.  Updated Vital Signs BP (!) 140/85 (BP Location: Right Arm)   Pulse 95   Temp (!) 100.6 F (38.1 C) (Oral)   Resp 17   SpO2 94%   Visual Acuity Right Eye Distance:   Left Eye Distance:   Bilateral Distance:    Right Eye Near:   Left Eye Near:    Bilateral Near:     Physical Exam Vitals and nursing note reviewed.  Constitutional:      Appearance: She is ill-appearing. She is not toxic-appearing.  HENT:     Head: Normocephalic and atraumatic.     Right Ear: Hearing, tympanic membrane, ear canal and external ear normal.     Left Ear: Hearing, tympanic membrane, ear canal and external ear normal.     Nose: Congestion present.     Mouth/Throat:     Lips: Pink.     Mouth: Mucous membranes are moist. No injury or oral lesions.     Dentition: Normal dentition.     Tongue: No lesions.     Pharynx: Oropharynx is clear. Uvula midline. No pharyngeal swelling, oropharyngeal exudate, posterior oropharyngeal erythema, uvula swelling or postnasal drip.     Tonsils: No tonsillar exudate.  Eyes:     General: Lids are normal. Vision grossly intact. Gaze aligned appropriately.     Extraocular Movements: Extraocular movements intact.     Conjunctiva/sclera: Conjunctivae normal.  Neck:     Trachea: Trachea and phonation normal.  Cardiovascular:     Rate and Rhythm: Normal  rate and regular rhythm.     Heart sounds: Normal heart sounds, S1 normal and S2 normal.  Pulmonary:     Effort: Pulmonary effort is normal. No respiratory distress.     Breath sounds: Normal breath sounds and air entry. No wheezing, rhonchi or rales.  Chest:  Chest wall: No tenderness.  Musculoskeletal:     Cervical back: Neck supple.  Lymphadenopathy:     Cervical: No cervical adenopathy.  Skin:    General: Skin is warm and dry.     Capillary Refill: Capillary refill takes less than 2 seconds.     Findings: No rash.  Neurological:     General: No focal deficit present.     Mental Status: She is alert and oriented to person, place, and time. Mental status is at baseline.     Cranial Nerves: No dysarthria or facial asymmetry.  Psychiatric:        Mood and Affect: Mood normal.        Speech: Speech normal.        Behavior: Behavior normal.        Thought Content: Thought content normal.        Judgment: Judgment normal.      UC Treatments / Results  Labs (all labs ordered are listed, but only abnormal results are displayed) Labs Reviewed  POC COVID19/FLU A&B COMBO - Normal  SARS CORONAVIRUS 2 (TAT 6-24 HRS)    EKG   Radiology No results found.  Procedures Procedures (including critical care time)  Medications Ordered in UC Medications  acetaminophen (TYLENOL) tablet 975 mg (975 mg Oral Given 10/03/23 0847)    Initial Impression / Assessment and Plan / UC Course  I have reviewed the triage vital signs and the nursing notes.  Pertinent labs & imaging results that were available during my care of the patient were reviewed by me and considered in my medical decision making (see chart for details).   1.  Influenza-like illness, fever, nausea without vomiting Presentation highly suspicious for influenza. Point-of-care influenza A and B and COVID testing is negative in clinic.  PCR COVID testing pending.  Given high clinical suspicion for influenza, we will go  ahead and treat with Tamiflu twice daily for 5 days. If COVID test comes back positive, patient to stop Tamiflu and start Paxlovid.  GFR 80 based on basic metabolic panel from 2021.   Recommend supportive care for symptomatic relief as outlined in AVS.  Lungs clear, therefore deferred imaging of the chest.  Vital signs stable.  Tylenol given for fever in clinic.  Counseled patient on potential for adverse effects with medications prescribed/recommended today, strict ER and return-to-clinic precautions discussed, patient verbalized understanding.    Final Clinical Impressions(s) / UC Diagnoses   Final diagnoses:  Influenza-like illness  Fever, unspecified  Nausea without vomiting     Discharge Instructions      You have a viral illness which will improve on its own with rest, fluids, and medications to help with your symptoms. Your flu and COVID tests were negative in the clinic. I would like to go ahead and treat you for the flu given my high clinical suspicion that you have influenza.  Take Tamiflu every 12 hours for the next 5 days. If your COVID PCR test is positive, staff will call you and have you stop taking Tamiflu and start taking Paxlovid.  Tylenol, guaifenesin (plain mucinex), and saline nasal sprays may help relieve symptoms.  Promethazine DM at bedtime as needed for cough. Zofran every 8 hours as needed for nausea/vomiting.   Two teaspoons of honey in 1 cup of warm water every 4-6 hours may help with throat pains.  Humidifier in room at nighttime may help soothe cough (clean well daily).   For chest pain, shortness of breath, inability  to keep food or fluids down without vomiting, fever that does not respond to tylenol or motrin, or any other severe symptoms, please go to the ER for further evaluation. Return to urgent care as needed, otherwise follow-up with PCP.      ED Prescriptions     Medication Sig Dispense Auth. Provider   promethazine-dextromethorphan  (PROMETHAZINE-DM) 6.25-15 MG/5ML syrup Take 5 mLs by mouth at bedtime as needed for cough. 118 mL Reita May M, FNP   ondansetron (ZOFRAN-ODT) 4 MG disintegrating tablet Take 1 tablet (4 mg total) by mouth every 8 (eight) hours as needed for nausea or vomiting. 20 tablet Reita May M, FNP   Guaifenesin 1200 MG TB12 Take 1 tablet (1,200 mg total) by mouth in the morning and at bedtime. 14 tablet Carlisle Beers, FNP   oseltamivir (TAMIFLU) 75 MG capsule Take 1 capsule (75 mg total) by mouth every 12 (twelve) hours. 10 capsule Carlisle Beers, FNP      PDMP not reviewed this encounter.   Reita May [place], [place] 10/03/23 [phone]

## 2023-10-04 LAB — SARS CORONAVIRUS 2 (TAT 6-24 HRS): SARS Coronavirus 2: NEGATIVE

## 2023-10-05 ENCOUNTER — Ambulatory Visit: Payer: Managed Care, Other (non HMO) | Admitting: Family Medicine

## 2023-11-08 NOTE — Progress Notes (Unsigned)
 [name] [address] [place] [address] Phone: [phone] Subjective:   Wendy Cummings, am serving as a scribe for Dr. Antoine Primas.  I'm seeing this patient by the request  of:  Pcp, No  CC: Back and neck pain follow-up  [id number]  Issabella Cummings is a 53 y.o. female coming in with complaint of back and neck pain. OMT 07/27/2023. Patient states that her appointment was pushed out due to having the flu. Then she went on a mission trip. Used prenisone when she was in [place]. Pain radiated down the L leg and had numbness. Pain seems to be intermittent. Pain also now doing down the back of R leg. Sitting down increases her pain. 8/10 pain.   Medications patient has been prescribed: None  Taking:         Reviewed prior external information including notes and imaging from previsou exam, outside providers and external EMR if available.   As well as notes that were available from care everywhere and other healthcare systems.  Past medical history, social, surgical and family history all reviewed in electronic medical record.  No pertanent information unless stated regarding to the chief complaint.   Past Medical History:  Diagnosis Date   Anemia    Arrhythmia    "fluttering feeling" does not see a cardiologist regularly   Coccyx pain    COVID-19 03/2019   no complications   GERD (gastroesophageal reflux disease)    History of shingles    left eye   Leg pain, bilateral    Leg swelling    Obesity     Allergies  Allergen Reactions   Tetanus Toxoid Swelling    SWELLING     Review of Systems:  No headache, visual changes, nausea, vomiting, diarrhea, constipation, dizziness, abdominal pain, skin rash, fevers, chills, night sweats, weight loss, swollen lymph nodes, body aches, joint swelling, chest pain, shortness of breath, mood changes. POSITIVE muscle aches  Objective  Blood pressure 118/86, pulse 60, height 5\' 6"   (1.676 m), weight 162 lb (73.5 kg), SpO2 99%.   General: No apparent distress alert and oriented x3 mood and affect normal, dressed appropriately.  HEENT: Pupils equal, extraocular movements intact  Respiratory: Patient's speak in full sentences and does not appear short of breath  Cardiovascular: No lower extremity edema, non tender, no erythema  Gait MSK:  Back does have loss lordosis noted.  Severe tenderness noted in the paraspinal musculature in the left gluteal area.  Positive straight leg test on the left side with radicular symptoms in the L4 and L5 distribution.  No weakness noted.  Worsening pain with extension.  Osteopathic findings  C2 flexed rotated and side bent right C6 flexed rotated and side bent left T3 extended rotated and side bent right inhaled rib T9 extended rotated and side bent left L2 flexed rotated and side bent right Sacrum right on right       Assessment and Plan:  Spinal stenosis of lumbar region Spinal stenosis with more of a neurogenic claudication as well as a lumbar radiculopathy that is concerning and now having some on the contralateral side.  Not a significant finding but still at the same level so I do feel that the epidural that has helped previously could be beneficial and ordered.  Round of prednisone also prescribed.  Patient given Toradol and Depo-Medrol injections today IM that I think will be helpful.  Follow-up with me 6 to 8 weeks.  Nonallopathic problems  Decision today to treat with OMT was based on Physical Exam  After verbal consent patient was treated with  ME, FPR techniques in cervical, rib, thoracic, lumbar, and sacral  areas  Patient tolerated the procedure well with improvement in symptoms  Patient given exercises, stretches and lifestyle modifications  See medications in patient instructions if given  Patient will follow up in 4-8 weeks    The above documentation has been reviewed and is accurate and complete  Judi Saa, DO          Note: This dictation was prepared with Dragon dictation along with smaller phrase technology. Any transcriptional errors that result from this process are unintentional.

## 2023-11-09 ENCOUNTER — Encounter: Payer: Self-pay | Admitting: Family Medicine

## 2023-11-09 ENCOUNTER — Ambulatory Visit (INDEPENDENT_AMBULATORY_CARE_PROVIDER_SITE_OTHER): Payer: Managed Care, Other (non HMO) | Admitting: Family Medicine

## 2023-11-09 VITALS — BP 118/86 | HR 60 | Ht 66.0 in | Wt 162.0 lb

## 2023-11-09 DIAGNOSIS — M9901 Segmental and somatic dysfunction of cervical region: Secondary | ICD-10-CM

## 2023-11-09 DIAGNOSIS — M5416 Radiculopathy, lumbar region: Secondary | ICD-10-CM | POA: Diagnosis not present

## 2023-11-09 DIAGNOSIS — M2550 Pain in unspecified joint: Secondary | ICD-10-CM

## 2023-11-09 DIAGNOSIS — M9902 Segmental and somatic dysfunction of thoracic region: Secondary | ICD-10-CM

## 2023-11-09 DIAGNOSIS — M9904 Segmental and somatic dysfunction of sacral region: Secondary | ICD-10-CM

## 2023-11-09 DIAGNOSIS — M9903 Segmental and somatic dysfunction of lumbar region: Secondary | ICD-10-CM

## 2023-11-09 DIAGNOSIS — M48062 Spinal stenosis, lumbar region with neurogenic claudication: Secondary | ICD-10-CM

## 2023-11-09 DIAGNOSIS — M9908 Segmental and somatic dysfunction of rib cage: Secondary | ICD-10-CM

## 2023-11-09 MED ORDER — METHYLPREDNISOLONE ACETATE 80 MG/ML IJ SUSP
80.0000 mg | Freq: Once | INTRAMUSCULAR | Status: AC
  Administered 2023-11-09: 80 mg via INTRAMUSCULAR

## 2023-11-09 MED ORDER — PREDNISONE 20 MG PO TABS: 40.0000 mg | ORAL_TABLET | Freq: Every day | ORAL | 0 refills | Status: DC

## 2023-11-09 MED ORDER — KETOROLAC TROMETHAMINE 60 MG/2ML IM SOLN
60.0000 mg | Freq: Once | INTRAMUSCULAR | Status: AC
  Administered 2023-11-09: 60 mg via INTRAMUSCULAR

## 2023-11-09 NOTE — Patient Instructions (Addendum)
 Injections in backside L4/L5 epidural [phone] Good luck with blackbear See me in 6-8 weeks

## 2023-11-09 NOTE — Assessment & Plan Note (Signed)
 Spinal stenosis with more of a neurogenic claudication as well as a lumbar radiculopathy that is concerning and now having some on the contralateral side.  Not a significant finding but still at the same level so I do feel that the epidural that has helped previously could be beneficial and ordered.  Round of prednisone also prescribed.  Patient given Toradol and Depo-Medrol injections today IM that I think will be helpful.  Follow-up with me 6 to 8 weeks.

## 2023-12-27 NOTE — Progress Notes (Signed)
 [name] [address] [place] [address] Phone: [phone] Subjective:   Wendy Cummings, am serving as a scribe for Dr. Ronnell Coins.  I'm seeing this patient by the request  of:  Pcp, No  CC: Neck and back pain follow-up  [id number]  Wendy Cummings is a 53 y.o. female coming in with complaint of back and neck pain. OMT 11/09/2023. Patient states numbness in fingers started recently when doing activities.  Medications patient has been prescribed: prednisone   Taking:    Since we have seen patient did have a workup for elevated alkaline phosphatase.  Has been unremarkable at the moment.  Continues to go up though.     Reviewed prior external information including notes and imaging from previsou exam, outside providers and external EMR if available.   As well as notes that were available from care everywhere and other healthcare systems.  Past medical history, social, surgical and family history all reviewed in electronic medical record.  No pertanent information unless stated regarding to the chief complaint.   Past Medical History:  Diagnosis Date   Anemia    Arrhythmia    "fluttering feeling" does not see a cardiologist regularly   Coccyx pain    COVID-19 03/2019   no complications   GERD (gastroesophageal reflux disease)    History of shingles    left eye   Leg pain, bilateral    Leg swelling    Obesity     Allergies  Allergen Reactions   Tetanus Toxoid Swelling    SWELLING     Review of Systems:  No headache, visual changes, nausea, vomiting, diarrhea, constipation, dizziness, abdominal pain, skin rash, fevers, chills, night sweats, weight loss, swollen lymph nodes, body aches, joint swelling, chest pain, shortness of breath, mood changes. POSITIVE muscle aches  Objective  Blood pressure 110/78, height 5\' 6"  (1.676 m), weight 154 lb (69.9 kg), SpO2 96%.   General: No apparent distress alert and oriented x3  mood and affect normal, dressed appropriately.  HEENT: Pupils equal, extraocular movements intact  Respiratory: Patient's speak in full sentences and does not appear short of breath  Cardiovascular: No lower extremity edema, non tender, no erythema  Gait MSK:  Back mild loss of lordosis noted.  Significant tightness of the hip flexor noted bilaterally.  Relatively good muscle strength noted.  Neck exam does have some mild loss lordosis noted.  Osteopathic findings  C6 flexed rotated and side bent left T3 extended rotated and side bent right inhaled rib T8 extended rotated and side bent left L2 flexed rotated and side bent right L5 flexed rotated and side bent left Sacrum right on right       Assessment and Plan:  Spinal stenosis of lumbar region Has done well.  I am concerned the patient is getting some deficiency in either vitamins and would like laboratory workup.  We discussed to that we need to increase protein to at least 100 g daily.  Discussed icing regimen and home exercises otherwise.  After evaluation attempted osteopathic manipulation.  Follow-up again in 6 to 8 weeks.  Bilateral hand numbness Seems to be secondary to the positioning on the bike but I am concerned also a B12 deficiency with patient's history of gastro bypass.  Will get laboratory workup to further evaluate.  Discussed changing him position and raising handlebars    Nonallopathic problems  Decision today to treat with OMT was based on Physical Exam  After verbal consent  patient was treated with HVLA, ME, FPR techniques in cervical, rib, thoracic, lumbar, and sacral  areas  Patient tolerated the procedure well with improvement in symptoms  Patient given exercises, stretches and lifestyle modifications  See medications in patient instructions if given  Patient will follow up in 4-8 weeks    The above documentation has been reviewed and is accurate and complete Wendy Margo, DO           Note: This dictation was prepared with Dragon dictation along with smaller phrase technology. Any transcriptional errors that result from this process are unintentional.

## 2023-12-28 ENCOUNTER — Ambulatory Visit (INDEPENDENT_AMBULATORY_CARE_PROVIDER_SITE_OTHER): Admitting: Family Medicine

## 2023-12-28 ENCOUNTER — Encounter: Payer: Self-pay | Admitting: Family Medicine

## 2023-12-28 ENCOUNTER — Ambulatory Visit (INDEPENDENT_AMBULATORY_CARE_PROVIDER_SITE_OTHER)

## 2023-12-28 ENCOUNTER — Ambulatory Visit: Payer: Self-pay | Admitting: Family Medicine

## 2023-12-28 VITALS — BP 110/78 | Ht 66.0 in | Wt 154.0 lb

## 2023-12-28 DIAGNOSIS — M9904 Segmental and somatic dysfunction of sacral region: Secondary | ICD-10-CM | POA: Diagnosis not present

## 2023-12-28 DIAGNOSIS — M9903 Segmental and somatic dysfunction of lumbar region: Secondary | ICD-10-CM

## 2023-12-28 DIAGNOSIS — R200 Anesthesia of skin: Secondary | ICD-10-CM

## 2023-12-28 DIAGNOSIS — R202 Paresthesia of skin: Secondary | ICD-10-CM | POA: Diagnosis not present

## 2023-12-28 DIAGNOSIS — M48062 Spinal stenosis, lumbar region with neurogenic claudication: Secondary | ICD-10-CM

## 2023-12-28 DIAGNOSIS — M9908 Segmental and somatic dysfunction of rib cage: Secondary | ICD-10-CM | POA: Diagnosis not present

## 2023-12-28 DIAGNOSIS — M2550 Pain in unspecified joint: Secondary | ICD-10-CM | POA: Diagnosis not present

## 2023-12-28 DIAGNOSIS — M9902 Segmental and somatic dysfunction of thoracic region: Secondary | ICD-10-CM

## 2023-12-28 DIAGNOSIS — M9901 Segmental and somatic dysfunction of cervical region: Secondary | ICD-10-CM

## 2023-12-28 LAB — FERRITIN: Ferritin: 11.5 ng/mL (ref 10.0–291.0)

## 2023-12-28 LAB — VITAMIN B12: Vitamin B-12: 1269 pg/mL — ABNORMAL HIGH (ref 211–911)

## 2023-12-28 LAB — IBC PANEL
Iron: 56 ug/dL (ref 42–145)
Saturation Ratios: 12.2 % — ABNORMAL LOW (ref 20.0–50.0)
TIBC: 457.8 ug/dL — ABNORMAL HIGH (ref 250.0–450.0)
Transferrin: 327 mg/dL (ref 212.0–360.0)

## 2023-12-28 LAB — COMPREHENSIVE METABOLIC PANEL WITH GFR
ALT: 37 U/L — ABNORMAL HIGH (ref 0–35)
AST: 38 U/L — ABNORMAL HIGH (ref 0–37)
Albumin: 4.3 g/dL (ref 3.5–5.2)
Alkaline Phosphatase: 139 U/L — ABNORMAL HIGH (ref 39–117)
BUN: 12 mg/dL (ref 6–23)
CO2: 25 meq/L (ref 19–32)
Calcium: 8.9 mg/dL (ref 8.4–10.5)
Chloride: 109 meq/L (ref 96–112)
Creatinine, Ser: 0.73 mg/dL (ref 0.40–1.20)
GFR: 92.9 mL/min (ref >60.00)
Glucose, Bld: 84 mg/dL (ref 70–99)
Potassium: 4.3 meq/L (ref 3.5–5.1)
Sodium: 140 meq/L (ref 135–145)
Total Bilirubin: 0.4 mg/dL (ref 0.2–1.2)
Total Protein: 7 g/dL (ref 6.0–8.3)

## 2023-12-28 LAB — CBC WITH DIFFERENTIAL/PLATELET
Basophils Absolute: 0.1 10*3/uL (ref 0.0–0.1)
Basophils Relative: 1.1 % (ref 0.0–3.0)
Eosinophils Absolute: 0.1 10*3/uL (ref 0.0–0.7)
Eosinophils Relative: 1.2 % (ref 0.0–5.0)
HCT: 40.1 % (ref 36.0–46.0)
Hemoglobin: 13.1 g/dL (ref 12.0–15.0)
Lymphocytes Relative: 44.4 % (ref 12.0–46.0)
Lymphs Abs: 2.8 10*3/uL (ref 0.7–4.0)
MCHC: 32.6 g/dL (ref 30.0–36.0)
MCV: 88.9 fl (ref 78.0–100.0)
Monocytes Absolute: 0.5 10*3/uL (ref 0.1–1.0)
Monocytes Relative: 8.1 % (ref 3.0–12.0)
Neutro Abs: 2.9 10*3/uL (ref 1.4–7.7)
Neutrophils Relative %: 45.2 % (ref 43.0–77.0)
Platelets: 314 10*3/uL (ref 150.0–400.0)
RBC: 4.51 Mil/uL (ref 3.87–5.11)
RDW: 14.8 % (ref 11.5–15.5)
WBC: 6.4 10*3/uL (ref 4.0–10.5)

## 2023-12-28 LAB — URIC ACID: Uric Acid, Serum: 3.7 mg/dL (ref 2.4–7.0)

## 2023-12-28 LAB — TSH: TSH: 2.22 u[IU]/mL (ref 0.35–5.50)

## 2023-12-28 LAB — VITAMIN D 25 HYDROXY (VIT D DEFICIENCY, FRACTURES): VITD: 51.35 ng/mL (ref 30.00–100.00)

## 2023-12-28 LAB — SEDIMENTATION RATE: Sed Rate: 7 mm/h (ref 0–30)

## 2023-12-28 NOTE — Patient Instructions (Addendum)
 Xray today Labs today Move handle bars up on bike Keep increasing the protein intake See you again in 2 months

## 2023-12-28 NOTE — Assessment & Plan Note (Signed)
 Seems to be secondary to the positioning on the bike but I am concerned also a B12 deficiency with patient's history of gastro bypass.  Will get laboratory workup to further evaluate.  Discussed changing him position and raising handlebars

## 2023-12-28 NOTE — Assessment & Plan Note (Signed)
 Has done well.  I am concerned the patient is getting some deficiency in either vitamins and would like laboratory workup.  We discussed to that we need to increase protein to at least 100 g daily.  Discussed icing regimen and home exercises otherwise.  After evaluation attempted osteopathic manipulation.  Follow-up again in 6 to 8 weeks.

## 2023-12-29 LAB — LIPID PANEL
Cholesterol: 179 mg/dL (ref <200)
HDL: 81 mg/dL (ref >=50)
LDL Cholesterol (Calc): 83 mg/dL
Non-HDL Cholesterol (Calc): 98 mg/dL (ref <130)
Total CHOL/HDL Ratio: 2.2 (calc) (ref <5.0)
Triglycerides: 71 mg/dL (ref <150)

## 2023-12-29 LAB — ANA: Anti Nuclear Antibody (ANA): NEGATIVE

## 2023-12-29 LAB — PTH, INTACT AND CALCIUM
Calcium: 9.2 mg/dL (ref 8.6–10.4)
PTH: 57 pg/mL (ref 16–77)

## 2024-01-03 ENCOUNTER — Encounter: Payer: Self-pay | Admitting: Family Medicine

## 2024-02-28 NOTE — Progress Notes (Unsigned)
 [name] [address] [place] [address] Phone: [phone] Subjective:   Wendy Cummings, am serving as a scribe for Dr. Arthea Claudene.  I'm seeing this patient by the request  of:  Pcp, No  CC: back and neck pain follow up   [id number]  Wendy Cummings is a 53 y.o. female coming in with complaint of back and neck pain. OMT 12/28/2023. Patient states that she has had to clean teeth more over past 2 weeks with employees being out. Pain with walking after working. Will radiate down the R leg but today she is doing fine. Going to be traveling a lot within the next 3 months. Wants to know if she should get epidural prior to traveling.   Medications patient has been prescribed: Prednisone   Taking:      Elevated LFTS    Reviewed prior external information including notes and imaging from previsou exam, outside providers and external EMR if available.   As well as notes that were available from care everywhere and other healthcare systems.  Past medical history, social, surgical and family history all reviewed in electronic medical record.  No pertanent information unless stated regarding to the chief complaint.   Past Medical History:  Diagnosis Date   Anemia    Arrhythmia    fluttering feeling does not see a cardiologist regularly   Coccyx pain    COVID-19 03/2019   no complications   GERD (gastroesophageal reflux disease)    History of shingles    left eye   Leg pain, bilateral    Leg swelling    Obesity     Allergies  Allergen Reactions   Tetanus Toxoid Swelling    SWELLING     Review of Systems:  No headache, visual changes, nausea, vomiting, diarrhea, constipation, dizziness, abdominal pain, skin rash, fevers, chills, night sweats, weight loss, swollen lymph nodes, body aches, joint swelling, chest pain, shortness of breath, mood changes. POSITIVE muscle aches  Objective  Blood pressure 120/84, pulse 62,  height 5' 6 (1.676 m), weight 153 lb (69.4 kg), SpO2 98%.   General: No apparent distress alert and oriented x3 mood and affect normal, dressed appropriately.  HEENT: Pupils equal, extraocular movements intact  Respiratory: Patient's speak in full sentences and does not appear short of breath  Cardiovascular: No lower extremity edema, non tender, no erythema  Gait MSK:  Back does have some loss lordosis noted.  Some tenderness to palpation in the paraspinal musculature.  Tightness noted with Deri right greater than left.  Osteopathic findings  C2 flexed rotated and side bent right C7 flexed rotated and side bent left T3 extended rotated and side bent right inhaled rib T9 extended rotated and side bent left L2 flexed rotated and side bent right L4 flexed rotated and side bent left Sacrum right on right       Assessment and Plan:  Nontraumatic coccydynia Patient does have degenerative disc disease noted.  Spinal stenosis noted lumbar spine.  Has responded well to injections including epidural in the back and it has been greater than 15 months since patient has had 1.  He has like that this would be beneficial with significant amount of traveling coming up in the near future.    Nonallopathic problems  Decision today to treat with OMT was based on Physical Exam  After verbal consent patient was treated with HVLA, ME, FPR techniques in cervical, rib, thoracic, lumbar, and sacral  areas  Patient  tolerated the procedure well with improvement in symptoms  Patient given exercises, stretches and lifestyle modifications  See medications in patient instructions if given  Patient will follow up in 4-8 weeks     The above documentation has been reviewed and is accurate and complete Wendy Salah M Rito Lecomte, DO         Note: This dictation was prepared with Dragon dictation along with smaller phrase technology. Any transcriptional errors that result from this process are  unintentional.

## 2024-02-29 ENCOUNTER — Ambulatory Visit (INDEPENDENT_AMBULATORY_CARE_PROVIDER_SITE_OTHER): Admitting: Family Medicine

## 2024-02-29 ENCOUNTER — Encounter: Payer: Self-pay | Admitting: Family Medicine

## 2024-02-29 VITALS — BP 120/84 | HR 62 | Ht 66.0 in | Wt 153.0 lb

## 2024-02-29 DIAGNOSIS — M9902 Segmental and somatic dysfunction of thoracic region: Secondary | ICD-10-CM | POA: Diagnosis not present

## 2024-02-29 DIAGNOSIS — M9908 Segmental and somatic dysfunction of rib cage: Secondary | ICD-10-CM

## 2024-02-29 DIAGNOSIS — M9903 Segmental and somatic dysfunction of lumbar region: Secondary | ICD-10-CM | POA: Diagnosis not present

## 2024-02-29 DIAGNOSIS — M9904 Segmental and somatic dysfunction of sacral region: Secondary | ICD-10-CM

## 2024-02-29 DIAGNOSIS — M533 Sacrococcygeal disorders, not elsewhere classified: Secondary | ICD-10-CM | POA: Diagnosis not present

## 2024-02-29 DIAGNOSIS — M9901 Segmental and somatic dysfunction of cervical region: Secondary | ICD-10-CM

## 2024-02-29 NOTE — Assessment & Plan Note (Addendum)
 Patient does have degenerative disc disease noted.  Spinal stenosis noted lumbar spine.  Has responded well to injections including epidural in the back and it has been greater than 15 months since patient has had 1.  He has like that this would be beneficial with significant amount of traveling coming up in the near future.  Follow-up again in 6 to 8 weeks

## 2024-03-13 ENCOUNTER — Encounter: Payer: Self-pay | Admitting: Family Medicine

## 2024-03-20 NOTE — Discharge Instructions (Signed)

## 2024-03-21 ENCOUNTER — Ambulatory Visit
Admission: RE | Admit: 2024-03-21 | Discharge: 2024-03-21 | Disposition: A | Discharge status: Home / Self Care | Source: Ambulatory Visit | Attending: Family Medicine | Admitting: Family Medicine

## 2024-03-21 DIAGNOSIS — M5416 Radiculopathy, lumbar region: Secondary | ICD-10-CM

## 2024-03-21 MED ORDER — METHYLPREDNISOLONE ACETATE 40 MG/ML INJ SUSP (RADIOLOG
80.0000 mg | Freq: Once | INTRAMUSCULAR | Status: AC
  Administered 2024-03-21: 80 mg via EPIDURAL

## 2024-03-21 MED ORDER — IOPAMIDOL (ISOVUE-M 200) INJECTION 41%
1.0000 mL | Freq: Once | INTRAMUSCULAR | Status: AC
  Administered 2024-03-21: 1 mL via EPIDURAL

## 2024-04-24 NOTE — Progress Notes (Unsigned)
  [facility] [address] [place] [address] Phone: [phone] Subjective:   Wendy Cummings, am serving as a scribe for Dr. Arthea Claudene.  I'm seeing this patient by the request  of:  Pcp, No  CC: Back and neck pain follow-up  YEP:Dlagzrupcz  Wendy Cummings is a 53 y.o. female coming in with complaint of back and neck pain. OMT 02/29/2024. Patient states that she is 80% better after epidural.   Medications patient has been prescribed: None  Taking:         Reviewed prior external information including notes and imaging from previsou exam, outside providers and external EMR if available.   As well as notes that were available from care everywhere and other healthcare systems.  Past medical history, social, surgical and family history all reviewed in electronic medical record.  No pertanent information unless stated regarding to the chief complaint.   Past Medical History:  Diagnosis Date   Anemia    Arrhythmia    fluttering feeling does not see a cardiologist regularly   Coccyx pain    COVID-19 03/2019   no complications   GERD (gastroesophageal reflux disease)    History of shingles    left eye   Leg pain, bilateral    Leg swelling    Obesity     Allergies  Allergen Reactions   Tetanus Toxoid Swelling    SWELLING     Review of Systems:  No headache, visual changes, nausea, vomiting, diarrhea, constipation, dizziness, abdominal pain, skin rash, fevers, chills, night sweats, weight loss, swollen lymph nodes, body aches, joint swelling, chest pain, shortness of breath, mood changes. POSITIVE muscle aches  Objective  Blood pressure 118/78, pulse (!) 59, height 5' 6 (1.676 m), weight 154 lb (69.9 kg), SpO2 100%.   General: No apparent distress alert and oriented x3 mood and affect normal, dressed appropriately.  HEENT: Pupils equal, extraocular movements intact  Respiratory: Patient's speak in full sentences and  does not appear short of breath  Cardiovascular: No lower extremity edema, non tender, no erythema  Gait normal MSK:  Back does have some loss lordosis noted.  Some tenderness to palpation in the paraspinal musculature.  Osteopathic findings  C2 flexed rotated and side bent right C6 flexed rotated and side bent left T3 extended rotated and side bent right inhaled rib T9 extended rotated and side bent left L2 flexed rotated and side bent right L3 flexed rotated and side bent left Sacrum right on right       Assessment and Plan:  No problem-specific Assessment & Plan notes found for this encounter.    Nonallopathic problems  Decision today to treat with OMT was based on Physical Exam  After verbal consent patient was treated with HVLA, ME, FPR techniques in cervical, rib, thoracic, lumbar, and sacral  areas  Patient tolerated the procedure well with improvement in symptoms  Patient given exercises, stretches and lifestyle modifications  See medications in patient instructions if given  Patient will follow up in 4-8 weeks     The above documentation has been reviewed and is accurate and complete Mckinzey Entwistle M Shaheen Mende, DO         Note: This dictation was prepared with Dragon dictation along with smaller phrase technology. Any transcriptional errors that result from this process are unintentional.

## 2024-04-25 ENCOUNTER — Ambulatory Visit: Admitting: Family Medicine

## 2024-04-25 ENCOUNTER — Encounter: Payer: Self-pay | Admitting: Family Medicine

## 2024-04-25 VITALS — BP 118/78 | HR 59 | Ht 66.0 in | Wt 154.0 lb

## 2024-04-25 DIAGNOSIS — M9908 Segmental and somatic dysfunction of rib cage: Secondary | ICD-10-CM | POA: Diagnosis not present

## 2024-04-25 DIAGNOSIS — M9901 Segmental and somatic dysfunction of cervical region: Secondary | ICD-10-CM

## 2024-04-25 DIAGNOSIS — M9904 Segmental and somatic dysfunction of sacral region: Secondary | ICD-10-CM | POA: Diagnosis not present

## 2024-04-25 DIAGNOSIS — M9902 Segmental and somatic dysfunction of thoracic region: Secondary | ICD-10-CM

## 2024-04-25 DIAGNOSIS — M48062 Spinal stenosis, lumbar region with neurogenic claudication: Secondary | ICD-10-CM

## 2024-04-25 DIAGNOSIS — M9903 Segmental and somatic dysfunction of lumbar region: Secondary | ICD-10-CM | POA: Diagnosis not present

## 2024-04-25 MED ORDER — PREDNISONE 20 MG PO TABS: 20.0000 mg | ORAL_TABLET | Freq: Every day | ORAL | 0 refills | Status: AC

## 2024-04-25 NOTE — Patient Instructions (Signed)
 Prednisone  20mg  for 10 days Consider benedryl cream Enjoy [place] See me in 2 months

## 2024-04-25 NOTE — Assessment & Plan Note (Signed)
 Found to have spinal stenosis and responded extremely well to the epidural with 80% decrease in the pain that patient was having previously.  We discussed with patient bound continuing core strengthening, continue to stay active.  Given prednisone  20 mg tablets with patient traveling to [place] soon.  Responded extremely well to osteopathic manipulation and focus more on muscle energy.  Follow-up with me again in 6 to 8 weeks.

## 2024-07-17 NOTE — Progress Notes (Signed)
 [name] [address] [place] [address] Phone: [phone] Subjective:   LILLETTE Berwyn Posey, am serving as a scribe for Dr. Arthea Claudene.  I'm seeing this patient by the request  of:  Pcp, No  CC:   YEP:Dlagzrupcz  Emili Mcloughlin is a 53 y.o. female coming in with complaint of back and neck pain. OMT 04/25/2024. Patient states that she did use prednisone  while in [place] but did a lot of walking while there.   Near the end of her trip she developed some red, hot patches over medial aspect of both calves. Symptoms improved after 3-4 days but since then she feels some pain over area where rash was located.   She is also having pain down back of L leg today. Pain is not new and she attributes it to wearing high heels last night.   Medications patient has been prescribed: None  Taking:         Reviewed prior external information including notes and imaging from previsou exam, outside providers and external EMR if available.   As well as notes that were available from care everywhere and other healthcare systems.  Past medical history, social, surgical and family history all reviewed in electronic medical record.  No pertanent information unless stated regarding to the chief complaint.   Past Medical History:  Diagnosis Date   Anemia    Arrhythmia    fluttering feeling does not see a cardiologist regularly   Coccyx pain    COVID-19 03/2019   no complications   GERD (gastroesophageal reflux disease)    History of shingles    left eye   Leg pain, bilateral    Leg swelling    Obesity     Allergies  Allergen Reactions   Tetanus Toxoid Swelling    SWELLING     Review of Systems:  No headache, visual changes, nausea, vomiting, diarrhea, constipation, dizziness, abdominal pain, skin rash, fevers, chills, night sweats, weight loss, swollen lymph nodes, body aches, joint swelling, chest pain, shortness of breath, mood changes.  POSITIVE muscle aches  Objective  Blood pressure 124/76, pulse 64, height 5' 6 (1.676 m), weight 152 lb (68.9 kg), SpO2 100%.   General: No apparent distress alert and oriented x3 mood and affect normal, dressed appropriately.  HEENT: Pupils equal, extraocular movements intact  Respiratory: Patient's speak in full sentences and does not appear short of breath  Cardiovascular: No lower extremity edema, non tender, no erythema  Gait MSK:  Back does have some loss of lordosis.  Tightness noted in the paraspinal musculature.  Worsening pain with extension of the back.  Still the 5 out of 5 strength in lower extremities.  Osteopathic findings  C2 flexed rotated and side bent right C6 flexed rotated and side bent left T3 extended rotated and side bent right inhaled rib T9 extended rotated and side bent left L2 flexed rotated and side bent right L3 flexed rotated and side bent left Sacrum right on right       Assessment and Plan:  Spinal stenosis of lumbar region Feels like it is more of an exacerbation of the spinal stenosis than the coccydynia.  We discussed with patient about continuing to work on core strength.  Discussed with patient increasing activity.  Has had some difficulty with leg swelling previously but seems to be somewhat better at this time.  Increase activity slowly.  Follow-up again in 6 to 12 weeks.    Nonallopathic problems  Decision today to treat with OMT was based on Physical Exam  After verbal consent patient was treated with HVLA, ME, FPR techniques in cervical, rib, thoracic, lumbar, and sacral  areas  Patient tolerated the procedure well with improvement in symptoms  Patient given exercises, stretches and lifestyle modifications  See medications in patient instructions if given  Patient will follow up in 4-8 weeks     The above documentation has been reviewed and is accurate and complete Yona Stansbury M Paulo Keimig, DO         Note: This dictation was  prepared with Dragon dictation along with smaller phrase technology. Any transcriptional errors that result from this process are unintentional.

## 2024-07-18 ENCOUNTER — Ambulatory Visit (INDEPENDENT_AMBULATORY_CARE_PROVIDER_SITE_OTHER): Admitting: Family Medicine

## 2024-07-18 ENCOUNTER — Encounter: Payer: Self-pay | Admitting: Family Medicine

## 2024-07-18 VITALS — BP 124/76 | HR 64 | Ht 66.0 in | Wt 152.0 lb

## 2024-07-18 DIAGNOSIS — M48062 Spinal stenosis, lumbar region with neurogenic claudication: Secondary | ICD-10-CM | POA: Diagnosis not present

## 2024-07-18 DIAGNOSIS — M9901 Segmental and somatic dysfunction of cervical region: Secondary | ICD-10-CM

## 2024-07-18 DIAGNOSIS — M9908 Segmental and somatic dysfunction of rib cage: Secondary | ICD-10-CM | POA: Diagnosis not present

## 2024-07-18 DIAGNOSIS — M9902 Segmental and somatic dysfunction of thoracic region: Secondary | ICD-10-CM

## 2024-07-18 DIAGNOSIS — M9904 Segmental and somatic dysfunction of sacral region: Secondary | ICD-10-CM | POA: Diagnosis not present

## 2024-07-18 DIAGNOSIS — M9903 Segmental and somatic dysfunction of lumbar region: Secondary | ICD-10-CM

## 2024-07-18 NOTE — Patient Instructions (Addendum)
 Eucerin (thick) to the legs 2x a day Good luck with the eye See you again in 10-12 weeks

## 2024-07-18 NOTE — Assessment & Plan Note (Signed)
 Feels like it is more of an exacerbation of the spinal stenosis than the coccydynia.  We discussed with patient about continuing to work on core strength.  Discussed with patient increasing activity.  Has had some difficulty with leg swelling previously but seems to be somewhat better at this time.  Increase activity slowly.  Follow-up again in 6 to 12 weeks.

## 2024-09-06 ENCOUNTER — Encounter: Payer: Self-pay | Admitting: Gastroenterology

## 2024-09-26 ENCOUNTER — Ambulatory Visit: Admitting: Family Medicine
